# Patient Record
Sex: Female | Born: 1990
Health system: Southern US, Community
[De-identification: ages and names within clinical notes are randomized; demographics above are authoritative.]

## PROBLEM LIST (undated history)

## (undated) ENCOUNTER — Inpatient Hospital Stay (HOSPITAL_COMMUNITY): Payer: Self-pay

## (undated) DIAGNOSIS — Z3400 Encounter for supervision of normal first pregnancy, unspecified trimester: Secondary | ICD-10-CM

## (undated) DIAGNOSIS — F411 Generalized anxiety disorder: Secondary | ICD-10-CM

## (undated) DIAGNOSIS — R51 Headache: Secondary | ICD-10-CM

## (undated) DIAGNOSIS — R519 Headache, unspecified: Secondary | ICD-10-CM

## (undated) DIAGNOSIS — F53 Postpartum depression: Principal | ICD-10-CM

## (undated) DIAGNOSIS — O99345 Other mental disorders complicating the puerperium: Principal | ICD-10-CM

## (undated) DIAGNOSIS — R87629 Unspecified abnormal cytological findings in specimens from vagina: Secondary | ICD-10-CM

## (undated) HISTORY — DX: Generalized anxiety disorder: F41.1

## (undated) HISTORY — DX: Encounter for supervision of normal first pregnancy, unspecified trimester: Z34.00

## (undated) HISTORY — PX: NO PAST SURGERIES: SHX2092

## (undated) HISTORY — DX: Other mental disorders complicating the puerperium: O99.345

## (undated) HISTORY — DX: Postpartum depression: F53.0

## (undated) HISTORY — DX: Headache, unspecified: R51.9

## (undated) HISTORY — DX: Unspecified abnormal cytological findings in specimens from vagina: R87.629

## (undated) HISTORY — DX: Headache: R51

---

## 2014-04-20 ENCOUNTER — Encounter (HOSPITAL_COMMUNITY): Payer: Self-pay | Admitting: Emergency Medicine

## 2014-04-20 ENCOUNTER — Emergency Department (HOSPITAL_COMMUNITY)
Admission: EM | Admit: 2014-04-20 | Discharge: 2014-04-21 | Disposition: A | Discharge status: Home / Self Care | Payer: Self-pay | Attending: Emergency Medicine | Admitting: Emergency Medicine

## 2014-04-20 DIAGNOSIS — Z720 Tobacco use: Secondary | ICD-10-CM | POA: Insufficient documentation

## 2014-04-20 DIAGNOSIS — Z3202 Encounter for pregnancy test, result negative: Secondary | ICD-10-CM | POA: Insufficient documentation

## 2014-04-20 DIAGNOSIS — K5900 Constipation, unspecified: Secondary | ICD-10-CM | POA: Insufficient documentation

## 2014-04-20 NOTE — ED Notes (Signed)
Pt arrived to the ED with a complaint of lower abdominal pain.  Pt states that she has been having intermittent sharp pain below her umbilicus.  Pt states she had hernia problems early in her life but it resolved itself many years ago.  Pt states pain has been present for two weeks.  Pt describes pain as sharp and intermittent.

## 2014-04-21 ENCOUNTER — Emergency Department (HOSPITAL_COMMUNITY): Payer: Self-pay

## 2014-04-21 LAB — URINALYSIS, ROUTINE W REFLEX MICROSCOPIC
GLUCOSE, UA: NEGATIVE mg/dL
Hgb urine dipstick: NEGATIVE
KETONES UR: NEGATIVE mg/dL
NITRITE: NEGATIVE
PH: 6 (ref 5.0–8.0)
Protein, ur: NEGATIVE mg/dL
Specific Gravity, Urine: 1.028 (ref 1.005–1.030)
Urobilinogen, UA: 1 mg/dL (ref 0.0–1.0)

## 2014-04-21 LAB — URINE MICROSCOPIC-ADD ON

## 2014-04-21 LAB — PREGNANCY, URINE: Preg Test, Ur: NEGATIVE

## 2014-04-21 MED ORDER — POLYETHYLENE GLYCOL 3350 17 GM/SCOOP PO POWD: 17.0000 g | Freq: Every day | ORAL | Status: DC

## 2014-04-21 NOTE — Discharge Instructions (Signed)
Constipation Constipation is when a person:  Poops (has a bowel movement) less than 3 times a week.  Has a hard time pooping.  Has poop that is dry, hard, or bigger than normal. HOME CARE   Eat foods with a lot of fiber in them. This includes fruits, vegetables, beans, and whole grains such as brown rice.  Avoid fatty foods and foods with a lot of sugar. This includes french fries, hamburgers, cookies, candy, and soda.  If you are not getting enough fiber from food, take products with added fiber in them (supplements).  Drink enough fluid to keep your pee (urine) clear or pale yellow.  Exercise on a regular basis, or as told by your doctor.  Go to the restroom when you feel like you need to poop. Do not hold it.  Only take medicine as told by your doctor. Do not take medicines that help you poop (laxatives) without talking to your doctor first. GET HELP RIGHT AWAY IF:   You have bright red blood in your poop (stool).  Your constipation lasts more than 4 days or gets worse.  You have belly (abdominal) or butt (rectal) pain.  You have thin poop (as thin as a pencil).  You lose weight, and it cannot be explained. MAKE SURE YOU:   Understand these instructions.  Will watch your condition.  Will get help right away if you are not doing well or get worse. Document Released: 08/08/2007 Document Revised: 02/24/2013 Document Reviewed: 12/01/2012 [facility] Patient Information 2015 [place], [place]. This information is not intended to replace advice given to you by your health care provider. Make sure you discuss any questions you have with your health care provider. No sign of hernia on your x-ray.  You do have quite a bit of stool in your colon.  This can cause some crampy intermittent abdominal pain.  Please take the [place] lax as directed to help you have regular bowel movements Constipation Constipation is when a person:  Poops (has a bowel movement) less than 3 times a  week.  Has a hard time pooping.  Has poop that is dry, hard, or bigger than normal. HOME CARE   Eat foods with a lot of fiber in them. This includes fruits, vegetables, beans, and whole grains such as brown rice.  Avoid fatty foods and foods with a lot of sugar. This includes french fries, hamburgers, cookies, candy, and soda.  If you are not getting enough fiber from food, take products with added fiber in them (supplements).  Drink enough fluid to keep your pee (urine) clear or pale yellow.  Exercise on a regular basis, or as told by your doctor.  Go to the restroom when you feel like you need to poop. Do not hold it.  Only take medicine as told by your doctor. Do not take medicines that help you poop (laxatives) without talking to your doctor first. GET HELP RIGHT AWAY IF:   You have bright red blood in your poop (stool).  Your constipation lasts more than 4 days or gets worse.  You have belly (abdominal) or butt (rectal) pain.  You have thin poop (as thin as a pencil).  You lose weight, and it cannot be explained. MAKE SURE YOU:   Understand these instructions.  Will watch your condition.  Will get help right away if you are not doing well or get worse. Document Released: 08/08/2007 Document Revised: 02/24/2013 Document Reviewed: 12/01/2012 [facility] Patient Information 2015 [place], [place]. This information is  not intended to replace advice given to you by your health care provider. Make sure you discuss any questions you have with your health care provider. ° °

## 2014-04-21 NOTE — ED Provider Notes (Signed)
CSN: [id number]     Arrival date & time 04/20/14  2130 History   First MD Initiated Contact with Patient 04/21/14 0112     Chief Complaint  Patient presents with  . Abdominal Pain     (Consider location/radiation/quality/duration/timing/severity/associated sxs/prior Treatment) HPI Comments: Patient states that since she was a child.  She's had periumbilical pain.  She was told at one point that she had an umbilical hernia that did not need surgery.  She presents now with intermittent.  Umbilical pain on and off for years, worse over the last 24 hours.  She states the pain comes it lasts approximate 10 minutes and resolves on its own.  She does not notice any bulging or protrusion of the umbilicus.  Denies any dysuria, nausea, vomiting, diarrhea, constipation, abdominal trauma, vaginal discharge.  States she's been on the Depo  shot and has not had a menstrual cycle in over a year  Patient is a 24 y.o. female presenting with abdominal pain. The history is provided by the patient.  Abdominal Pain Pain location:  Periumbilical Pain quality: aching   Pain radiates to:  Does not radiate Pain severity:  No pain Onset quality:  Unable to specify Timing:  Intermittent Progression:  Worsening Chronicity:  Chronic Relieved by:  None tried Worsened by:  Nothing tried Ineffective treatments:  None tried Associated symptoms: no constipation, no cough, no diarrhea, no dysuria, no fever, no nausea, no shortness of breath and no vaginal discharge     History reviewed. No pertinent past medical history. History reviewed. No pertinent past surgical history. History reviewed. No pertinent family history. History  Substance Use Topics  . Smoking status: Current Some Day Smoker -- 0.50 packs/day    Types: Cigarettes  . Smokeless tobacco: Never Used  . Alcohol Use: Yes   OB History    No data available     Review of Systems  Constitutional: Negative for fever.  Respiratory: Negative for cough  and shortness of breath.   Gastrointestinal: Positive for abdominal pain. Negative for nausea, diarrhea, constipation and abdominal distention.  Genitourinary: Negative for dysuria, flank pain and vaginal discharge.  Skin: Negative for rash.  All other systems reviewed and are negative.     Allergies  Tomato  Home Medications   Prior to Admission medications   Medication Sig Start Date End Date Taking? Authorizing Provider  ibuprofen (ADVIL,MOTRIN) 200 MG tablet Take 400 mg by mouth every 6 (six) hours as needed for moderate pain.   Yes Historical Provider, MD  medroxyPROGESTERone (DEPO-PROVERA) 150 MG/ML injection Inject 150 mg into the muscle every 3 (three) months.   Yes Historical Provider, MD  polyethylene glycol powder (GLYCOLAX/MIRALAX) powder Take 17 g by mouth daily. 04/21/14   Arman Filter, NP   BP 115/59 mmHg  Pulse 66  Temp(Src) 98.3 F (36.8 C) (Oral)  Resp 16  SpO2 98%  LMP  Physical Exam  Constitutional: She is oriented to person, place, and time. She appears well-developed and well-nourished.  HENT:  Head: Normocephalic and atraumatic.  Eyes: Pupils are equal, round, and reactive to light.  Cardiovascular: Normal rate.   Pulmonary/Chest: Effort normal and breath sounds normal.  Abdominal: Soft. Bowel sounds are normal. She exhibits no distension. There is no tenderness.  Musculoskeletal: Normal range of motion.  Neurological: She is alert and oriented to person, place, and time.  Skin: Skin is warm and dry.  Nursing note and vitals reviewed.   ED Course  Procedures (including critical care time)  Labs Review Labs Reviewed  URINALYSIS, ROUTINE W REFLEX MICROSCOPIC - Abnormal; Notable for the following:    Color, Urine AMBER (*)    APPearance HAZY (*)    Bilirubin Urine SMALL (*)    Leukocytes, UA SMALL (*)    All other components within normal limits  URINE MICROSCOPIC-ADD ON - Abnormal; Notable for the following:    Squamous Epithelial / LPF FEW (*)     Bacteria, UA FEW (*)    All other components within normal limits  PREGNANCY, URINE    Imaging Review Dg Abd Acute W/chest  04/21/2014   CLINICAL DATA:  Initial evaluation for umbilical pain for 2 weeks.  EXAM: ACUTE ABDOMEN SERIES (ABDOMEN 2 VIEW & CHEST 1 VIEW)  COMPARISON:  None.  FINDINGS: Cardiac and mediastinal silhouettes are within normal limits.  Lungs are normally inflated. No focal infiltrate, pulmonary edema, or pleural effusion. There is no pneumothorax.  Bowel gas pattern is nonobstructive. Paucity of gas limits evaluation of the small bowel. Moderate amount of retained stool present throughout the colon. No abnormal bowel wall thickening. No free air. No soft tissue mass or abnormal calcification.  No acute osseous abnormality.  IMPRESSION: 1. Nonobstructive bowel gas pattern with no radiographic evidence for acute intra-abdominal process. 2. Moderate amount of retained stool within the colon, which may reflect constipation. 3. No active cardiopulmonary disease.   Electronically Signed   By: Rise MuBenjamin  McClintock M.D.   On: 04/21/2014 02:22     EKG Interpretation None      MDM   Final diagnoses:  Constipation, unspecified constipation type         Arman FilterGail K Yazeed Pryer, NP 04/21/14 [id number]  Olivia Mackielga M Otter, MD 04/21/14 [phone]

## 2016-12-27 ENCOUNTER — Ambulatory Visit (INDEPENDENT_AMBULATORY_CARE_PROVIDER_SITE_OTHER): Payer: Federal, State, Local not specified - PPO

## 2016-12-27 VITALS — BP 127/77 | HR 68 | Ht 61.0 in | Wt 146.0 lb

## 2016-12-27 DIAGNOSIS — Z3400 Encounter for supervision of normal first pregnancy, unspecified trimester: Secondary | ICD-10-CM

## 2016-12-27 DIAGNOSIS — Z3201 Encounter for pregnancy test, result positive: Secondary | ICD-10-CM | POA: Diagnosis not present

## 2016-12-27 DIAGNOSIS — Z3200 Encounter for pregnancy test, result unknown: Secondary | ICD-10-CM

## 2016-12-27 HISTORY — DX: Encounter for supervision of normal first pregnancy, unspecified trimester: Z34.00

## 2016-12-27 LAB — POCT URINE PREGNANCY: Preg Test, Ur: POSITIVE — AB

## 2016-12-27 NOTE — Progress Notes (Signed)
Presents for UPT-POSITIVE. LMP 11/03/16; EDD 08/10/17. Patient advised to make appt for NOB care.  . [name] presents today for UPT. She has no unusual complaints. LMP: 11/03/16    OBJECTIVE: Appears well, in no apparent distress.  OB History    Gravida Para Term Preterm AB Living   1             SAB TAB Ectopic Multiple Live Births                 Home UPT Result:Positive In-Office UPT result: POSITIVE  I have reviewed the patient's medical, obstetrical, social, and family histories, and medications.   ASSESSMENT: Positive pregnancy test  PLAN Prenatal care to be completed at: [facility]

## 2016-12-31 NOTE — Progress Notes (Signed)
Agree with nursing staff's documentation of this patient's clinic encounter.  Jozette Castrellon A Annalei Friesz, CNM    

## 2017-01-21 ENCOUNTER — Encounter: Payer: Self-pay | Admitting: Obstetrics and Gynecology

## 2017-01-21 ENCOUNTER — Ambulatory Visit (INDEPENDENT_AMBULATORY_CARE_PROVIDER_SITE_OTHER): Payer: Federal, State, Local not specified - PPO | Admitting: Obstetrics and Gynecology

## 2017-01-21 DIAGNOSIS — Z23 Encounter for immunization: Secondary | ICD-10-CM

## 2017-01-21 DIAGNOSIS — Z113 Encounter for screening for infections with a predominantly sexual mode of transmission: Secondary | ICD-10-CM | POA: Diagnosis not present

## 2017-01-21 DIAGNOSIS — Z3401 Encounter for supervision of normal first pregnancy, first trimester: Secondary | ICD-10-CM | POA: Diagnosis not present

## 2017-01-21 DIAGNOSIS — Z3400 Encounter for supervision of normal first pregnancy, unspecified trimester: Secondary | ICD-10-CM | POA: Diagnosis not present

## 2017-01-21 DIAGNOSIS — Z124 Encounter for screening for malignant neoplasm of cervix: Secondary | ICD-10-CM | POA: Diagnosis not present

## 2017-01-21 MED ORDER — VITAFOL ULTRA 29-0.6-0.4-200 MG PO CAPS: 1.0000 | ORAL_CAPSULE | Freq: Every day | ORAL | 12 refills | Status: DC

## 2017-01-21 NOTE — Patient Instructions (Signed)
 First Trimester of Pregnancy The first trimester of pregnancy is from week 1 until the end of week 13 (months 1 through 3). A week after a sperm fertilizes an egg, the egg will implant on the wall of the uterus. This embryo will begin to develop into a baby. Genes from you and your partner will form the baby. The female genes will determine whether the baby will be a boy or a girl. At 6-8 weeks, the eyes and face will be formed, and the heartbeat can be seen on ultrasound. At the end of 12 weeks, all the baby's organs will be formed. Now that you are pregnant, you will want to do everything you can to have a healthy baby. Two of the most important things are to get good prenatal care and to follow your health care provider's instructions. Prenatal care is all the medical care you receive before the baby's birth. This care will help prevent, find, and treat any problems during the pregnancy and childbirth. Body changes during your first trimester Your body goes through many changes during pregnancy. The changes vary from woman to woman.  You may gain or lose a couple of pounds at first.  You may feel sick to your stomach (nauseous) and you may throw up (vomit). If the vomiting is uncontrollable, call your health care provider.  You may tire easily.  You may develop headaches that can be relieved by medicines. All medicines should be approved by your health care provider.  You may urinate more often. Painful urination may mean you have a bladder infection.  You may develop heartburn as a result of your pregnancy.  You may develop constipation because certain hormones are causing the muscles that push stool through your intestines to slow down.  You may develop hemorrhoids or swollen veins (varicose veins).  Your breasts may begin to grow larger and become tender. Your nipples may stick out more, and the tissue that surrounds them (areola) may become darker.  Your gums may bleed and may be  sensitive to brushing and flossing.  Dark spots or blotches (chloasma, mask of pregnancy) may develop on your face. This will likely fade after the baby is born.  Your menstrual periods will stop.  You may have a loss of appetite.  You may develop cravings for certain kinds of food.  You may have changes in your emotions from day to day, such as being excited to be pregnant or being concerned that something may go wrong with the pregnancy and baby.  You may have more vivid and strange dreams.  You may have changes in your hair. These can include thickening of your hair, rapid growth, and changes in texture. Some women also have hair loss during or after pregnancy, or hair that feels dry or thin. Your hair will most likely return to normal after your baby is born.  What to expect at prenatal visits During a routine prenatal visit:  You will be weighed to make sure you and the baby are growing normally.  Your blood pressure will be taken.  Your abdomen will be measured to track your baby's growth.  The fetal heartbeat will be listened to between weeks 10 and 14 of your pregnancy.  Test results from any previous visits will be discussed.  Your health care provider may ask you:  How you are feeling.  If you are feeling the baby move.  If you have had any abnormal symptoms, such as leaking fluid, bleeding, severe   headaches, or abdominal cramping.  If you are using any tobacco products, including cigarettes, chewing tobacco, and electronic cigarettes.  If you have any questions.  Other tests that may be performed during your first trimester include:  Blood tests to find your blood type and to check for the presence of any previous infections. The tests will also be used to check for low iron levels (anemia) and protein on red blood cells (Rh antibodies). Depending on your risk factors, or if you previously had diabetes during pregnancy, you may have tests to check for high blood  sugar that affects pregnant women (gestational diabetes).  Urine tests to check for infections, diabetes, or protein in the urine.  An ultrasound to confirm the proper growth and development of the baby.  Fetal screens for spinal cord problems (spina bifida) and Down syndrome.  HIV (human immunodeficiency virus) testing. Routine prenatal testing includes screening for HIV, unless you choose not to have this test.  You may need other tests to make sure you and the baby are doing well.  Follow these instructions at home: Medicines  Follow your health care provider's instructions regarding medicine use. Specific medicines may be either safe or unsafe to take during pregnancy.  Take a prenatal vitamin that contains at least 600 micrograms (mcg) of folic acid.  If you develop constipation, try taking a stool softener if your health care provider approves. Eating and drinking  Eat a balanced diet that includes fresh fruits and vegetables, whole grains, good sources of protein such as meat, eggs, or tofu, and low-fat dairy. Your health care provider will help you determine the amount of weight gain that is right for you.  Avoid raw meat and uncooked cheese. These carry germs that can cause birth defects in the baby.  Eating four or five small meals rather than three large meals a day may help relieve nausea and vomiting. If you start to feel nauseous, eating a few soda crackers can be helpful. Drinking liquids between meals, instead of during meals, also seems to help ease nausea and vomiting.  Limit foods that are high in fat and processed sugars, such as fried and sweet foods.  To prevent constipation: ? Eat foods that are high in fiber, such as fresh fruits and vegetables, whole grains, and beans. ? Drink enough fluid to keep your urine clear or pale yellow. Activity  Exercise only as directed by your health care provider. Most women can continue their usual exercise routine during  pregnancy. Try to exercise for 30 minutes at least 5 days a week. Exercising will help you: ? Control your weight. ? Stay in shape. ? Be prepared for labor and delivery.  Experiencing pain or cramping in the lower abdomen or lower back is a good sign that you should stop exercising. Check with your health care provider before continuing with normal exercises.  Try to avoid standing for long periods of time. Move your legs often if you must stand in one place for a long time.  Avoid heavy lifting.  Wear low-heeled shoes and practice good posture.  You may continue to have sex unless your health care provider tells you not to. Relieving pain and discomfort  Wear a good support bra to relieve breast tenderness.  Take warm sitz baths to soothe any pain or discomfort caused by hemorrhoids. Use hemorrhoid cream if your health care provider approves.  Rest with your legs elevated if you have leg cramps or low back pain.  If you   develop varicose veins in your legs, wear support hose. Elevate your feet for 15 minutes, 3-4 times a day. Limit salt in your diet. Prenatal care  Schedule your prenatal visits by the twelfth week of pregnancy. They are usually scheduled monthly at first, then more often in the last 2 months before delivery.  Write down your questions. Take them to your prenatal visits.  Keep all your prenatal visits as told by your health care provider. This is important. Safety  Wear your seat belt at all times when driving.  Make a list of emergency phone numbers, including numbers for family, friends, the hospital, and police and fire departments. General instructions  Ask your health care provider for a referral to a local prenatal education class. Begin classes no later than the beginning of month 6 of your pregnancy.  Ask for help if you have counseling or nutritional needs during pregnancy. Your health care provider can offer advice or refer you to specialists for help  with various needs.  Do not use hot tubs, steam rooms, or saunas.  Do not douche or use tampons or scented sanitary pads.  Do not cross your legs for long periods of time.  Avoid cat litter boxes and soil used by cats. These carry germs that can cause birth defects in the baby and possibly loss of the fetus by miscarriage or stillbirth.  Avoid all smoking, herbs, alcohol, and medicines not prescribed by your health care provider. Chemicals in these products affect the formation and growth of the baby.  Do not use any products that contain nicotine or tobacco, such as cigarettes and e-cigarettes. If you need help quitting, ask your health care provider. You may receive counseling support and other resources to help you quit.  Schedule a dentist appointment. At home, brush your teeth with a soft toothbrush and be gentle when you floss. Contact a health care provider if:  You have dizziness.  You have mild pelvic cramps, pelvic pressure, or nagging pain in the abdominal area.  You have persistent nausea, vomiting, or diarrhea.  You have a bad smelling vaginal discharge.  You have pain when you urinate.  You notice increased swelling in your face, hands, legs, or ankles.  You are exposed to fifth disease or chickenpox.  You are exposed to German measles (rubella) and have never had it. Get help right away if:  You have a fever.  You are leaking fluid from your vagina.  You have spotting or bleeding from your vagina.  You have severe abdominal cramping or pain.  You have rapid weight gain or loss.  You vomit blood or material that looks like coffee grounds.  You develop a severe headache.  You have shortness of breath.  You have any kind of trauma, such as from a fall or a car accident. Summary  The first trimester of pregnancy is from week 1 until the end of week 13 (months 1 through 3).  Your body goes through many changes during pregnancy. The changes vary from  woman to woman.  You will have routine prenatal visits. During those visits, your health care provider will examine you, discuss any test results you may have, and talk with you about how you are feeling. This information is not intended to replace advice given to you by your health care provider. Make sure you discuss any questions you have with your health care provider. Document Released: 02/13/2001 Document Revised: 02/01/2016 Document Reviewed: 02/01/2016 Elsevier Interactive Patient Education  2017   [employer]   Second Trimester of Pregnancy The second trimester is from week 14 through week 27 (months 4 through 6). The second trimester is often a time when you feel your best. Your body has adjusted to being pregnant, and you begin to feel better physically. Usually, morning sickness has lessened or quit completely, you may have more energy, and you may have an increase in appetite. The second trimester is also a time when the fetus is growing rapidly. At the end of the sixth month, the fetus is about 9 inches long and weighs about 1 pounds. You will likely begin to feel the baby move (quickening) between 16 and 20 weeks of pregnancy. Body changes during your second trimester Your body continues to go through many changes during your second trimester. The changes vary from woman to woman.  Your weight will continue to increase. You will notice your lower abdomen bulging out.  You may begin to get stretch marks on your hips, abdomen, and breasts.  You may develop headaches that can be relieved by medicines. The medicines should be approved by your health care provider.  You may urinate more often because the fetus is pressing on your bladder.  You may develop or continue to have heartburn as a result of your pregnancy.  You may develop constipation because certain hormones are causing the muscles that push waste through your intestines to slow down.  You may develop hemorrhoids or  swollen, bulging veins (varicose veins).  You may have back pain. This is caused by: ? Weight gain. ? Pregnancy hormones that are relaxing the joints in your pelvis. ? A shift in weight and the muscles that support your balance.  Your breasts will continue to grow and they will continue to become tender.  Your gums may bleed and may be sensitive to brushing and flossing.  Dark spots or blotches (chloasma, mask of pregnancy) may develop on your face. This will likely fade after the baby is born.  A dark line from your belly button to the pubic area (linea nigra) may appear. This will likely fade after the baby is born.  You may have changes in your hair. These can include thickening of your hair, rapid growth, and changes in texture. Some women also have hair loss during or after pregnancy, or hair that feels dry or thin. Your hair will most likely return to normal after your baby is born.  What to expect at prenatal visits During a routine prenatal visit:  You will be weighed to make sure you and the fetus are growing normally.  Your blood pressure will be taken.  Your abdomen will be measured to track your baby's growth.  The fetal heartbeat will be listened to.  Any test results from the previous visit will be discussed.  Your health care provider may ask you:  How you are feeling.  If you are feeling the baby move.  If you have had any abnormal symptoms, such as leaking fluid, bleeding, severe headaches, or abdominal cramping.  If you are using any tobacco products, including cigarettes, chewing tobacco, and electronic cigarettes.  If you have any questions.  Other tests that may be performed during your second trimester include:  Blood tests that check for: ? Low iron levels (anemia). ? High blood sugar that affects pregnant women (gestational diabetes) between 24 and 28 weeks. ? Rh antibodies. This is to check for a protein on red blood cells (Rh factor).  Urine  tests to   check for infections, diabetes, or protein in the urine.  An ultrasound to confirm the proper growth and development of the baby.  An amniocentesis to check for possible genetic problems.  Fetal screens for spina bifida and Down syndrome.  HIV (human immunodeficiency virus) testing. Routine prenatal testing includes screening for HIV, unless you choose not to have this test.  Follow these instructions at home: Medicines  Follow your health care provider's instructions regarding medicine use. Specific medicines may be either safe or unsafe to take during pregnancy.  Take a prenatal vitamin that contains at least 600 micrograms (mcg) of folic acid.  If you develop constipation, try taking a stool softener if your health care provider approves. Eating and drinking  Eat a balanced diet that includes fresh fruits and vegetables, whole grains, good sources of protein such as meat, eggs, or tofu, and low-fat dairy. Your health care provider will help you determine the amount of weight gain that is right for you.  Avoid raw meat and uncooked cheese. These carry germs that can cause birth defects in the baby.  If you have low calcium intake from food, talk to your health care provider about whether you should take a daily calcium supplement.  Limit foods that are high in fat and processed sugars, such as fried and sweet foods.  To prevent constipation: ? Drink enough fluid to keep your urine clear or pale yellow. ? Eat foods that are high in fiber, such as fresh fruits and vegetables, whole grains, and beans. Activity  Exercise only as directed by your health care provider. Most women can continue their usual exercise routine during pregnancy. Try to exercise for 30 minutes at least 5 days a week. Stop exercising if you experience uterine contractions.  Avoid heavy lifting, wear low heel shoes, and practice good posture.  A sexual relationship may be continued unless your health  care provider directs you otherwise. Relieving pain and discomfort  Wear a good support bra to prevent discomfort from breast tenderness.  Take warm sitz baths to soothe any pain or discomfort caused by hemorrhoids. Use hemorrhoid cream if your health care provider approves.  Rest with your legs elevated if you have leg cramps or low back pain.  If you develop varicose veins, wear support hose. Elevate your feet for 15 minutes, 3-4 times a day. Limit salt in your diet. Prenatal Care  Write down your questions. Take them to your prenatal visits.  Keep all your prenatal visits as told by your health care provider. This is important. Safety  Wear your seat belt at all times when driving.  Make a list of emergency phone numbers, including numbers for family, friends, the hospital, and police and fire departments. General instructions  Ask your health care provider for a referral to a local prenatal education class. Begin classes no later than the beginning of month 6 of your pregnancy.  Ask for help if you have counseling or nutritional needs during pregnancy. Your health care provider can offer advice or refer you to specialists for help with various needs.  Do not use hot tubs, steam rooms, or saunas.  Do not douche or use tampons or scented sanitary pads.  Do not cross your legs for long periods of time.  Avoid cat litter boxes and soil used by cats. These carry germs that can cause birth defects in the baby and possibly loss of the fetus by miscarriage or stillbirth.  Avoid all smoking, herbs, alcohol, and unprescribed drugs. Chemicals   in these products can affect the formation and growth of the baby.  Do not use any products that contain nicotine or tobacco, such as cigarettes and e-cigarettes. If you need help quitting, ask your health care provider.  Visit your dentist if you have not gone yet during your pregnancy. Use a soft toothbrush to brush your teeth and be gentle when  you floss. Contact a health care provider if:  You have dizziness.  You have mild pelvic cramps, pelvic pressure, or nagging pain in the abdominal area.  You have persistent nausea, vomiting, or diarrhea.  You have a bad smelling vaginal discharge.  You have pain when you urinate. Get help right away if:  You have a fever.  You are leaking fluid from your vagina.  You have spotting or bleeding from your vagina.  You have severe abdominal cramping or pain.  You have rapid weight gain or weight loss.  You have shortness of breath with chest pain.  You notice sudden or extreme swelling of your face, hands, ankles, feet, or legs.  You have not felt your baby move in over an hour.  You have severe headaches that do not go away when you take medicine.  You have vision changes. Summary  The second trimester is from week 14 through week 27 (months 4 through 6). It is also a time when the fetus is growing rapidly.  Your body goes through many changes during pregnancy. The changes vary from woman to woman.  Avoid all smoking, herbs, alcohol, and unprescribed drugs. These chemicals affect the formation and growth your baby.  Do not use any tobacco products, such as cigarettes, chewing tobacco, and e-cigarettes. If you need help quitting, ask your health care provider.  Contact your health care provider if you have any questions. Keep all prenatal visits as told by your health care provider. This is important. This information is not intended to replace advice given to you by your health care provider. Make sure you discuss any questions you have with your health care provider. Document Released: 02/13/2001 Document Revised: 07/28/2015 Document Reviewed: 04/22/2012 Elsevier Interactive Patient Education  2017 [employer].  Contraception Choices Contraception (birth control) is the use of any methods or devices to prevent pregnancy. Below are some methods to help avoid  pregnancy. Hormonal methods  Contraceptive implant. This is a thin, plastic tube containing progesterone hormone. It does not contain estrogen hormone. Your health care provider inserts the tube in the inner part of the upper arm. The tube can remain in place for up to 3 years. After 3 years, the implant must be removed. The implant prevents the ovaries from releasing an egg (ovulation), thickens the cervical mucus to prevent sperm from entering the uterus, and thins the lining of the inside of the uterus.  Progesterone-only injections. These injections are given every 3 months by your health care provider to prevent pregnancy. This synthetic progesterone hormone stops the ovaries from releasing eggs. It also thickens cervical mucus and changes the uterine lining. This makes it harder for sperm to survive in the uterus.  Birth control pills. These pills contain estrogen and progesterone hormone. They work by preventing the ovaries from releasing eggs (ovulation). They also cause the cervical mucus to thicken, preventing the sperm from entering the uterus. Birth control pills are prescribed by a health care provider.Birth control pills can also be used to treat heavy periods.  Minipill. This type of birth control pill contains only the progesterone hormone. They are  taken every day of each month and must be prescribed by your health care provider.  Birth control patch. The patch contains hormones similar to those in birth control pills. It must be changed once a week and is prescribed by a health care provider.  Vaginal ring. The ring contains hormones similar to those in birth control pills. It is left in the vagina for 3 weeks, removed for 1 week, and then a new one is put back in place. The patient must be comfortable inserting and removing the ring from the vagina.A health care provider's prescription is necessary.  Emergency contraception. Emergency contraceptives prevent pregnancy after  unprotected sexual intercourse. This pill can be taken right after sex or up to 5 days after unprotected sex. It is most effective the sooner you take the pills after having sexual intercourse. Most emergency contraceptive pills are available without a prescription. Check with your pharmacist. Do not use emergency contraception as your only form of birth control. Barrier methods  Female condom. This is a thin sheath (latex or rubber) that is worn over the penis during sexual intercourse. It can be used with spermicide to increase effectiveness.  Female condom. This is a soft, loose-fitting sheath that is put into the vagina before sexual intercourse.  Diaphragm. This is a soft, latex, dome-shaped barrier that must be fitted by a health care provider. It is inserted into the vagina, along with a spermicidal jelly. It is inserted before intercourse. The diaphragm should be left in the vagina for 6 to 8 hours after intercourse.  Cervical cap. This is a round, soft, latex or plastic cup that fits over the cervix and must be fitted by a health care provider. The cap can be left in place for up to 48 hours after intercourse.  Sponge. This is a soft, circular piece of polyurethane foam. The sponge has spermicide in it. It is inserted into the vagina after wetting it and before sexual intercourse.  Spermicides. These are chemicals that kill or block sperm from entering the cervix and uterus. They come in the form of creams, jellies, suppositories, foam, or tablets. They do not require a prescription. They are inserted into the vagina with an applicator before having sexual intercourse. The process must be repeated every time you have sexual intercourse. Intrauterine contraception  Intrauterine device (IUD). This is a T-shaped device that is put in a woman's uterus during a menstrual period to prevent pregnancy. There are 2 types: ? Copper IUD. This type of IUD is wrapped in copper wire and is placed inside  the uterus. Copper makes the uterus and fallopian tubes produce a fluid that kills sperm. It can stay in place for 10 years. ? Hormone IUD. This type of IUD contains the hormone progestin (synthetic progesterone). The hormone thickens the cervical mucus and prevents sperm from entering the uterus, and it also thins the uterine lining to prevent implantation of a fertilized egg. The hormone can weaken or kill the sperm that get into the uterus. It can stay in place for 3-5 years, depending on which type of IUD is used. Permanent methods of contraception  Female tubal ligation. This is when the woman's fallopian tubes are surgically sealed, tied, or blocked to prevent the egg from traveling to the uterus.  Hysteroscopic sterilization. This involves placing a small coil or insert into each fallopian tube. Your doctor uses a technique called hysteroscopy to do the procedure. The device causes scar tissue to form. This results in permanent blockage  of the fallopian tubes, so the sperm cannot fertilize the egg. It takes about 3 months after the procedure for the tubes to become blocked. You must use another form of birth control for these 3 months.  Female sterilization. This is when the female has the tubes that carry sperm tied off (vasectomy).This blocks sperm from entering the vagina during sexual intercourse. After the procedure, the man can still ejaculate fluid (semen). Natural planning methods  Natural family planning. This is not having sexual intercourse or using a barrier method (condom, diaphragm, cervical cap) on days the woman could become pregnant.  Calendar method. This is keeping track of the length of each menstrual cycle and identifying when you are fertile.  Ovulation method. This is avoiding sexual intercourse during ovulation.  Symptothermal method. This is avoiding sexual intercourse during ovulation, using a thermometer and ovulation symptoms.  Post-ovulation method. This is timing  sexual intercourse after you have ovulated. Regardless of which type or method of contraception you choose, it is important that you use condoms to protect against the transmission of sexually transmitted infections (STIs). Talk with your health care provider about which form of contraception is most appropriate for you. This information is not intended to replace advice given to you by your health care provider. Make sure you discuss any questions you have with your health care provider. Document Released: 02/19/2005 Document Revised: 07/28/2015 Document Reviewed: 08/14/2012 Elsevier Interactive Patient Education  2017 [employer]   Breastfeeding Deciding to breastfeed is one of the best choices you can make for you and your baby. A change in hormones during pregnancy causes your breast tissue to grow and increases the number and size of your milk ducts. These hormones also allow proteins, sugars, and fats from your blood supply to make breast milk in your milk-producing glands. Hormones prevent breast milk from being released before your baby is born as well as prompt milk flow after birth. Once breastfeeding has begun, thoughts of your baby, as well as his or her sucking or crying, can stimulate the release of milk from your milk-producing glands. Benefits of breastfeeding For Your Baby  Your first milk (colostrum) helps your baby's digestive system function better.  There are antibodies in your milk that help your baby fight off infections.  Your baby has a lower incidence of asthma, allergies, and sudden infant death syndrome.  The nutrients in breast milk are better for your baby than infant formulas and are designed uniquely for your baby's needs.  Breast milk improves your baby's brain development.  Your baby is less likely to develop other conditions, such as childhood obesity, asthma, or type 2 diabetes mellitus.  For You  Breastfeeding helps to create a very special bond  between you and your baby.  Breastfeeding is convenient. Breast milk is always available at the correct temperature and costs nothing.  Breastfeeding helps to burn calories and helps you lose the weight gained during pregnancy.  Breastfeeding makes your uterus contract to its prepregnancy size faster and slows bleeding (lochia) after you give birth.  Breastfeeding helps to lower your risk of developing type 2 diabetes mellitus, osteoporosis, and breast or ovarian cancer later in life.  Signs that your baby is hungry Early Signs of Hunger  Increased alertness or activity.  Stretching.  Movement of the head from side to side.  Movement of the head and opening of the mouth when the corner of the mouth or cheek is stroked (rooting).  Increased sucking sounds, smacking lips,  cooing, sighing, or squeaking.  Hand-to-mouth movements.  Increased sucking of fingers or hands.  Late Signs of Hunger  Fussing.  Intermittent crying.  Extreme Signs of Hunger Signs of extreme hunger will require calming and consoling before your baby will be able to breastfeed successfully. Do not wait for the following signs of extreme hunger to occur before you initiate breastfeeding:  Restlessness.  A loud, strong cry.  Screaming.  Breastfeeding basics Breastfeeding Initiation  Find a comfortable place to sit or lie down, with your neck and back well supported.  Place a pillow or rolled up blanket under your baby to bring him or her to the level of your breast (if you are seated). Nursing pillows are specially designed to help support your arms and your baby while you breastfeed.  Make sure that your baby's abdomen is facing your abdomen.  Gently massage your breast. With your fingertips, massage from your chest wall toward your nipple in a circular motion. This encourages milk flow. You may need to continue this action during the feeding if your milk flows slowly.  Support your breast with 4  fingers underneath and your thumb above your nipple. Make sure your fingers are well away from your nipple and your baby's mouth.  Stroke your baby's lips gently with your finger or nipple.  When your baby's mouth is open wide enough, quickly bring your baby to your breast, placing your entire nipple and as much of the colored area around your nipple (areola) as possible into your baby's mouth. ? More areola should be visible above your baby's upper lip than below the lower lip. ? Your baby's tongue should be between his or her lower gum and your breast.  Ensure that your baby's mouth is correctly positioned around your nipple (latched). Your baby's lips should create a seal on your breast and be turned out (everted).  It is common for your baby to suck about 2-3 minutes in order to start the flow of breast milk.  Latching Teaching your baby how to latch on to your breast properly is very important. An improper latch can cause nipple pain and decreased milk supply for you and poor weight gain in your baby. Also, if your baby is not latched onto your nipple properly, he or she may swallow some air during feeding. This can make your baby fussy. Burping your baby when you switch breasts during the feeding can help to get rid of the air. However, teaching your baby to latch on properly is still the best way to prevent fussiness from swallowing air while breastfeeding. Signs that your baby has successfully latched on to your nipple:  Silent tugging or silent sucking, without causing you pain.  Swallowing heard between every 3-4 sucks.  Muscle movement above and in front of his or her ears while sucking.  Signs that your baby has not successfully latched on to nipple:  Sucking sounds or smacking sounds from your baby while breastfeeding.  Nipple pain.  If you think your baby has not latched on correctly, slip your finger into the corner of your baby's mouth to break the suction and place it  between your baby's gums. Attempt breastfeeding initiation again. Signs of Successful Breastfeeding Signs from your baby:  A gradual decrease in the number of sucks or complete cessation of sucking.  Falling asleep.  Relaxation of his or her body.  Retention of a small amount of milk in his or her mouth.  Letting go of your breast  by himself or herself.  Signs from you:  Breasts that have increased in firmness, weight, and size 1-3 hours after feeding.  Breasts that are softer immediately after breastfeeding.  Increased milk volume, as well as a change in milk consistency and color by the fifth day of breastfeeding.  Nipples that are not sore, cracked, or bleeding.  Signs That Your Pecola LeisureBaby is Getting Enough Milk  Wetting at least 1-2 diapers during the first 24 hours after birth.  Wetting at least 5-6 diapers every 24 hours for the first week after birth. The urine should be clear or pale yellow by 5 days after birth.  Wetting 6-8 diapers every 24 hours as your baby continues to grow and develop.  At least 3 stools in a 24-hour period by age 804 days. The stool should be soft and yellow.  At least 3 stools in a 24-hour period by age 75 days. The stool should be seedy and yellow.  No loss of weight greater than 10% of birth weight during the first 643 days of age.  Average weight gain of 4-7 ounces (113-198 g) per week after age 80 days.  Consistent daily weight gain by age 804 days, without weight loss after the age of 2 weeks.  After a feeding, your baby may spit up a small amount. This is common. Breastfeeding frequency and duration Frequent feeding will help you make more milk and can prevent sore nipples and breast engorgement. Breastfeed when you feel the need to reduce the fullness of your breasts or when your baby shows signs of hunger. This is called "breastfeeding on demand." Avoid introducing a pacifier to your baby while you are working to establish breastfeeding (the  first 4-6 weeks after your baby is born). After this time you may choose to use a pacifier. Research has shown that pacifier use during the first year of a baby's life decreases the risk of sudden infant death syndrome (SIDS). Allow your baby to feed on each breast as long as he or she wants. Breastfeed until your baby is finished feeding. When your baby unlatches or falls asleep while feeding from the first breast, offer the second breast. Because newborns are often sleepy in the first few weeks of life, you may need to awaken your baby to get him or her to feed. Breastfeeding times will vary from baby to baby. However, the following rules can serve as a guide to help you ensure that your baby is properly fed:  Newborns (babies 294 weeks of age or younger) may breastfeed every 1-3 hours.  Newborns should not go longer than 3 hours during the day or 5 hours during the night without breastfeeding.  You should breastfeed your baby a minimum of 8 times in a 24-hour period until you begin to introduce solid foods to your baby at around 1086 months of age.  Breast milk pumping Pumping and storing breast milk allows you to ensure that your baby is exclusively fed your breast milk, even at times when you are unable to breastfeed. This is especially important if you are going back to work while you are still breastfeeding or when you are not able to be present during feedings. Your lactation consultant can give you guidelines on how long it is safe to store breast milk. A breast pump is a machine that allows you to pump milk from your breast into a sterile bottle. The pumped breast milk can then be stored in a refrigerator or freezer. Some breast  pumps are operated by hand, while others use electricity. Ask your lactation consultant which type will work best for you. Breast pumps can be purchased, but some hospitals and breastfeeding support groups lease breast pumps on a monthly basis. A lactation consultant can  teach you how to hand express breast milk, if you prefer not to use a pump. Caring for your breasts while you breastfeed Nipples can become dry, cracked, and sore while breastfeeding. The following recommendations can help keep your breasts moisturized and healthy:  Avoid using soap on your nipples.  Wear a supportive bra. Although not required, special nursing bras and tank tops are designed to allow access to your breasts for breastfeeding without taking off your entire bra or top. Avoid wearing underwire-style bras or extremely tight bras.  Air dry your nipples for 3-754minutes after each feeding.  Use only cotton bra pads to absorb leaked breast milk. Leaking of breast milk between feedings is normal.  Use lanolin on your nipples after breastfeeding. Lanolin helps to maintain your skin's normal moisture barrier. If you use pure lanolin, you do not need to wash it off before feeding your baby again. Pure lanolin is not toxic to your baby. You may also hand express a few drops of breast milk and gently massage that milk into your nipples and allow the milk to air dry.  In the first few weeks after giving birth, some women experience extremely full breasts (engorgement). Engorgement can make your breasts feel heavy, warm, and tender to the touch. Engorgement peaks within 3-5 days after you give birth. The following recommendations can help ease engorgement:  Completely empty your breasts while breastfeeding or pumping. You may want to start by applying warm, moist heat (in the shower or with warm water-soaked hand towels) just before feeding or pumping. This increases circulation and helps the milk flow. If your baby does not completely empty your breasts while breastfeeding, pump any extra milk after he or she is finished.  Wear a snug bra (nursing or regular) or tank top for 1-2 days to signal your body to slightly decrease milk production.  Apply ice packs to your breasts, unless this is too  uncomfortable for you.  Make sure that your baby is latched on and positioned properly while breastfeeding.  If engorgement persists after 48 hours of following these recommendations, contact your health care provider or a Advertising copywriterlactation consultant. Overall health care recommendations while breastfeeding  Eat healthy foods. Alternate between meals and snacks, eating 3 of each per day. Because what you eat affects your breast milk, some of the foods may make your baby more irritable than usual. Avoid eating these foods if you are sure that they are negatively affecting your baby.  Drink milk, fruit juice, and water to satisfy your thirst (about 10 glasses a day).  Rest often, relax, and continue to take your prenatal vitamins to prevent fatigue, stress, and anemia.  Continue breast self-awareness checks.  Avoid chewing and smoking tobacco. Chemicals from cigarettes that pass into breast milk and exposure to secondhand smoke may harm your baby.  Avoid alcohol and drug use, including marijuana. Some medicines that may be harmful to your baby can pass through breast milk. It is important to ask your health care provider before taking any medicine, including all over-the-counter and prescription medicine as well as vitamin and herbal supplements. It is possible to become pregnant while breastfeeding. If birth control is desired, ask your health care provider about options that will be safe  for your baby. Contact a health care provider if:  You feel like you want to stop breastfeeding or have become frustrated with breastfeeding.  You have painful breasts or nipples.  Your nipples are cracked or bleeding.  Your breasts are red, tender, or warm.  You have a swollen area on either breast.  You have a fever or chills.  You have nausea or vomiting.  You have drainage other than breast milk from your nipples.  Your breasts do not become full before feedings by the fifth day after you give  birth.  You feel sad and depressed.  Your baby is too sleepy to eat well.  Your baby is having trouble sleeping.  Your baby is wetting less than 3 diapers in a 24-hour period.  Your baby has less than 3 stools in a 24-hour period.  Your baby's skin or the white part of his or her eyes becomes yellow.  Your baby is not gaining weight by 785 days of age. Get help right away if:  Your baby is overly tired (lethargic) and does not want to wake up and feed.  Your baby develops an unexplained fever. This information is not intended to replace advice given to you by your health care provider. Make sure you discuss any questions you have with your health care provider. Document Released: 02/19/2005 Document Revised: 08/03/2015 Document Reviewed: 08/13/2012 Elsevier Interactive Patient Education  2017 [employer]

## 2017-01-21 NOTE — Addendum Note (Signed)
Addended by: Dalphine HandingGARDNER, Yao [place]pp[place] L on: 01/21/2017 09:58 AM   Modules accepted: Orders

## 2017-01-21 NOTE — Progress Notes (Signed)
Pt c/o migraines and N/V. No relief with Tylenol.

## 2017-01-21 NOTE — Progress Notes (Signed)
  Subjective:    Alexandra Hart is a G1P0 1355w2d being seen today for her first obstetrical visit.  Her obstetrical history is significant for first pregnancy. Patient does intend to breast feed. Pregnancy history fully reviewed.  Patient reports nausea and vomiting.  Vitals:   01/21/17 0911  BP: 115/71  Pulse: 79  Weight: 144 lb (65.3 kg)    HISTORY: OB History  Gravida Para Term Preterm AB Living  1            SAB TAB Ectopic Multiple Live Births               # Outcome Date GA Lbr Len/2nd Weight Sex Delivery Anes PTL Lv  1 Current              Past Medical History:  Diagnosis Date  . Headache   . Vaginal Pap smear, abnormal    History reviewed. No pertinent surgical history. Family History  Problem Relation Age of Onset  . Varicose Veins Mother   . Miscarriages / Stillbirths Sister   . Asthma Maternal Grandmother   . Diabetes Maternal Grandmother   . Hypertension Maternal Grandmother   . Varicose Veins Maternal Grandmother      Exam    Uterus:   12-weeks  Pelvic Exam:    Perineum: No Hemorrhoids, Normal Perineum   Vulva: normal   Vagina:  normal mucosa, normal discharge   pH:    Cervix: nulliparous appearance   Adnexa: normal adnexa and no mass, fullness, tenderness   Bony Pelvis: gynecoid  System: Breast:  normal appearance, no masses or tenderness   Skin: normal coloration and turgor, no rashes    Neurologic: oriented, no focal deficits   Extremities: normal strength, tone, and muscle mass   HEENT extra ocular movement intact   Mouth/Teeth mucous membranes moist, pharynx normal without lesions and dental hygiene good   Neck supple and no masses   Cardiovascular: regular rate and rhythm   Respiratory:  chest clear, no wheezing, crepitations, rhonchi, normal symmetric air entry   Abdomen: soft, non-tender; bowel sounds normal; no masses,  no organomegaly   Urinary:       Assessment:    Pregnancy: G1P0 Patient Active Problem List   Diagnosis  Date Noted  . Supervision of normal first pregnancy, antepartum 12/27/2016        Plan:     Initial labs drawn. Prenatal vitamins. Problem list reviewed and updated. Genetic Screening discussed First Screen: ordered.  Ultrasound discussed; fetal survey: requested.  Follow up in 4 weeks. 50% of 30 min visit spent on counseling and coordination of care.     Alexandra Hart 01/21/2017

## 2017-01-22 ENCOUNTER — Encounter (HOSPITAL_COMMUNITY): Payer: Self-pay | Admitting: Obstetrics and Gynecology

## 2017-01-22 LAB — CERVICOVAGINAL ANCILLARY ONLY
Bacterial vaginitis: NEGATIVE
CANDIDA VAGINITIS: NEGATIVE
CHLAMYDIA, DNA PROBE: NEGATIVE
Neisseria Gonorrhea: NEGATIVE
TRICH (WINDOWPATH): NEGATIVE

## 2017-01-22 LAB — CYTOLOGY - PAP: Diagnosis: NEGATIVE

## 2017-01-23 LAB — OBSTETRIC PANEL, INCLUDING HIV
ANTIBODY SCREEN: NEGATIVE
Basophils Absolute: 0 10*3/uL (ref 0.0–0.2)
Basos: 0 %
EOS (ABSOLUTE): 0.1 10*3/uL (ref 0.0–0.4)
EOS: 2 %
HEMATOCRIT: 38.2 % (ref 34.0–46.6)
HEMOGLOBIN: 12.9 g/dL (ref 11.1–15.9)
HIV Screen 4th Generation wRfx: NONREACTIVE
Hepatitis B Surface Ag: NEGATIVE
IMMATURE GRANS (ABS): 0 10*3/uL (ref 0.0–0.1)
Immature Granulocytes: 0 %
Lymphocytes Absolute: 2.3 10*3/uL (ref 0.7–3.1)
Lymphs: 33 %
MCH: 29.7 pg (ref 26.6–33.0)
MCHC: 33.8 g/dL (ref 31.5–35.7)
MCV: 88 fL (ref 79–97)
MONOS ABS: 0.4 10*3/uL (ref 0.1–0.9)
Monocytes: 6 %
Neutrophils Absolute: 4 10*3/uL (ref 1.4–7.0)
Neutrophils: 59 %
Platelets: 239 10*3/uL (ref 150–379)
RBC: 4.35 x10E6/uL (ref 3.77–5.28)
RDW: 13.6 % (ref 12.3–15.4)
RPR Ser Ql: NONREACTIVE
Rh Factor: POSITIVE
Rubella Antibodies, IGG: 4.92 index (ref >0.99)
WBC: 6.8 10*3/uL (ref 3.4–10.8)

## 2017-01-23 LAB — HEMOGLOBINOPATHY EVALUATION
HEMOGLOBIN A2 QUANTITATION: 2.4 % (ref 1.8–3.2)
HGB C: 0 %
HGB S: 0 %
HGB VARIANT: 0 %
Hemoglobin F Quantitation: 0 % (ref 0.0–2.0)
Hgb A: 97.6 % (ref 96.4–98.8)

## 2017-01-23 LAB — CULTURE, OB URINE

## 2017-01-23 LAB — URINE CULTURE, OB REFLEX

## 2017-01-29 ENCOUNTER — Ambulatory Visit (HOSPITAL_COMMUNITY)
Admission: RE | Admit: 2017-01-29 | Discharge: 2017-01-29 | Disposition: A | Discharge status: Home / Self Care | Payer: Federal, State, Local not specified - PPO | Source: Ambulatory Visit | Attending: Obstetrics and Gynecology | Admitting: Obstetrics and Gynecology

## 2017-01-29 ENCOUNTER — Other Ambulatory Visit: Payer: Self-pay | Admitting: Obstetrics and Gynecology

## 2017-01-29 ENCOUNTER — Encounter (HOSPITAL_COMMUNITY): Payer: Self-pay

## 2017-01-29 DIAGNOSIS — Z3401 Encounter for supervision of normal first pregnancy, first trimester: Secondary | ICD-10-CM | POA: Insufficient documentation

## 2017-01-29 DIAGNOSIS — Z3682 Encounter for antenatal screening for nuchal translucency: Secondary | ICD-10-CM

## 2017-01-29 DIAGNOSIS — Z3400 Encounter for supervision of normal first pregnancy, unspecified trimester: Secondary | ICD-10-CM

## 2017-01-29 DIAGNOSIS — O281 Abnormal biochemical finding on antenatal screening of mother: Secondary | ICD-10-CM | POA: Diagnosis not present

## 2017-01-29 DIAGNOSIS — Z3A12 12 weeks gestation of pregnancy: Secondary | ICD-10-CM | POA: Insufficient documentation

## 2017-01-29 LAB — CYSTIC FIBROSIS MUTATION 97: Interpretation: NOT DETECTED

## 2017-01-30 LAB — SMN1 COPY NUMBER ANALYSIS (SMA CARRIER SCREENING)

## 2017-02-14 ENCOUNTER — Other Ambulatory Visit (HOSPITAL_COMMUNITY): Payer: Self-pay

## 2017-02-18 ENCOUNTER — Telehealth: Payer: Self-pay | Admitting: *Deleted

## 2017-02-18 ENCOUNTER — Encounter: Payer: Self-pay | Admitting: Obstetrics and Gynecology

## 2017-02-18 ENCOUNTER — Ambulatory Visit (INDEPENDENT_AMBULATORY_CARE_PROVIDER_SITE_OTHER): Payer: Federal, State, Local not specified - PPO | Admitting: Obstetrics and Gynecology

## 2017-02-18 DIAGNOSIS — Z3400 Encounter for supervision of normal first pregnancy, unspecified trimester: Secondary | ICD-10-CM | POA: Diagnosis not present

## 2017-02-18 MED ORDER — PRENATAL ADULT GUMMY/DHA/FA 0.4-25 MG PO CHEW: 2.0000 | CHEWABLE_TABLET | Freq: Every day | ORAL | 5 refills | Status: DC

## 2017-02-18 NOTE — Progress Notes (Signed)
   PRENATAL VISIT NOTE  Subjective:  Alexandra Hart is a 26 y.o. G1P0 at 2421w2d being seen today for ongoing prenatal care.  She is currently monitored for the following issues for this low-risk pregnancy and has Supervision of normal first pregnancy, antepartum on their problem list.  Patient reports no complaints.  Contractions: Not present. Vag. Bleeding: None.  Movement: Present. Denies leaking of fluid.   The following portions of the patient's history were reviewed and updated as appropriate: allergies, current medications, past family history, past medical history, past social history, past surgical history and problem list. Problem list updated.  Objective:   Vitals:   02/18/17 1012  BP: (!) 114/59  Pulse: 73  Weight: 146 lb (66.2 kg)    Fetal Status: Fetal Heart Rate (bpm): 152   Movement: Present     General:  Alert, oriented and cooperative. Patient is in no acute distress.  Skin: Skin is warm and dry. No rash noted.   Cardiovascular: Normal heart rate noted  Respiratory: Normal respiratory effort, no problems with respiration noted  Abdomen: Soft, gravid, appropriate for gestational age.  Pain/Pressure: Absent     Pelvic: Cervical exam deferred        Extremities: Normal range of motion.  Edema: None  Mental Status:  Normal mood and affect. Normal behavior. Normal judgment and thought content.   Assessment and Plan:  Pregnancy: G1P0 at 9221w2d  1. Supervision of normal first pregnancy, antepartum Patient is doing well without complaints Quad screen today Anatomy ultrasound ordered - US MFM OB COMP + 14 WK; Future - AFP TETRA  Preterm labor symptoms and general obstetric precautions including but not limited to vaginal bleeding, contractions, leaking of fluid and fetal movement were reviewed in detail with the patient. Please refer to After Visit Summary for other counseling recommendations.  Return in about 4 weeks (around 03/18/2017) for ROB.   Catalina AntiguaPeggy Yetunde Leis,  MD

## 2017-02-18 NOTE — Telephone Encounter (Signed)
Call placed to pt to make her aware that the PNV that was sent in is not covered by her insurance. Pt was made aware that she may call pharmacy and check her copay and/or cash price. Pt made aware if Rx to expensive she may try OTC PNV or may take Flinstone vitamin.  Pt states she will check with pharmacy and make us aware if she has any other needs.

## 2017-02-21 LAB — AFP TETRA
DIA Mom Value: 0.73
DIA VALUE (EIA): 134.07 pg/mL
DSR (By Age)    1 IN: 1035
DSR (SECOND TRIMESTER) 1 IN: 9256
Gestational Age: 15.3 WEEKS
MSAFP Mom: 1.57
MSAFP: 49.2 ng/mL
MSHCG Mom: 0.3
MSHCG: 14609 m[IU]/mL
Maternal Age At EDD: 24.9 yr
OSB RISK: 4541
Test Results:: NEGATIVE
UE3 VALUE: 0.44 ng/mL
WEIGHT: 146 [lb_av]
uE3 Mom: 0.7

## 2017-02-27 ENCOUNTER — Encounter: Payer: Self-pay | Admitting: *Deleted

## 2017-03-05 NOTE — L&D Delivery Note (Signed)
Patient is a 27 y.o. now G1P1 s/p NSVD at 7449w6d, who was admitted for SOL.  She progressed without augmentation to complete and pushed 18 minutes to deliver in water birth tub.  Cord clamping delayed by several minutes then clamped by CNM and cut by FOB.  Placenta intact and spontaneous, bleeding minimal.  Bilateral labial laceration identified- not repaired, hemostatic.  Mom and baby stable prior to transfer to postpartum. She plans on breastfeeding. She is undecided about method for birth control.  Delivery Note At 11:45 AM a viable and healthy female was delivered via Vaginal, Spontaneous (Presentation: LOA).  APGAR: 8, 9; weight pending .   Placenta intact and spontaneous, bleeding minimal. 3VCord:  with no complications.  Anesthesia: None Episiotomy: None Lacerations: Labial Suture Repair: None Est. Blood Loss (mL): 200  Mom to postpartum.  Baby to Couplet care / Skin to Skin.  Sharyon CableVeronica C Aviona Martenson CNM 08/09/2017, 12:06 PM

## 2017-03-12 ENCOUNTER — Other Ambulatory Visit: Payer: Self-pay | Admitting: Obstetrics and Gynecology

## 2017-03-12 ENCOUNTER — Ambulatory Visit (HOSPITAL_COMMUNITY)
Admission: RE | Admit: 2017-03-12 | Discharge: 2017-03-12 | Disposition: A | Discharge status: Home / Self Care | Payer: Federal, State, Local not specified - PPO | Source: Ambulatory Visit | Attending: Obstetrics and Gynecology | Admitting: Obstetrics and Gynecology

## 2017-03-12 DIAGNOSIS — Z3689 Encounter for other specified antenatal screening: Secondary | ICD-10-CM | POA: Diagnosis not present

## 2017-03-12 DIAGNOSIS — Z3400 Encounter for supervision of normal first pregnancy, unspecified trimester: Secondary | ICD-10-CM

## 2017-03-12 DIAGNOSIS — Z3A18 18 weeks gestation of pregnancy: Secondary | ICD-10-CM | POA: Insufficient documentation

## 2017-03-18 ENCOUNTER — Encounter: Payer: Self-pay | Admitting: Obstetrics and Gynecology

## 2017-03-18 ENCOUNTER — Ambulatory Visit (INDEPENDENT_AMBULATORY_CARE_PROVIDER_SITE_OTHER): Payer: Federal, State, Local not specified - PPO | Admitting: Obstetrics and Gynecology

## 2017-03-18 DIAGNOSIS — Z3402 Encounter for supervision of normal first pregnancy, second trimester: Secondary | ICD-10-CM

## 2017-03-18 DIAGNOSIS — Z3400 Encounter for supervision of normal first pregnancy, unspecified trimester: Secondary | ICD-10-CM

## 2017-03-18 NOTE — Progress Notes (Signed)
   PRENATAL VISIT NOTE  Subjective:  Alexandra Hart is a 27 y.o. G1P0 at 555w2d being seen today for ongoing prenatal care.  She is currently monitored for the following issues for this low-risk pregnancy and has Supervision of normal first pregnancy, antepartum on their problem list.  Patient reports no complaints.  Contractions: Not present. Vag. Bleeding: None.  Movement: Present. Denies leaking of fluid.   The following portions of the patient's history were reviewed and updated as appropriate: allergies, current medications, past family history, past medical history, past social history, past surgical history and problem list. Problem list updated.  Objective:   Vitals:   03/18/17 1035  BP: 114/72  Pulse: 81  Weight: 152 lb 6.4 oz (69.1 kg)    Fetal Status: Fetal Heart Rate (bpm): 156 Fundal Height: 20 cm Movement: Present     General:  Alert, oriented and cooperative. Patient is in no acute distress.  Skin: Skin is warm and dry. No rash noted.   Cardiovascular: Normal heart rate noted  Respiratory: Normal respiratory effort, no problems with respiration noted  Abdomen: Soft, gravid, appropriate for gestational age.  Pain/Pressure: Absent     Pelvic: Cervical exam deferred        Extremities: Normal range of motion.  Edema: None  Mental Status:  Normal mood and affect. Normal behavior. Normal judgment and thought content.   Assessment and Plan:  Pregnancy: G1P0 at 3555w2d  1. Supervision of normal first pregnancy, antepartum Patient is doing well without complaints Follow up anatomy ultrasound ordered Patient planning on eliminating red meat from her diet. She is still planning on consuming poultry and fish - US MFM OB FOLLOW UP; Future  Preterm labor symptoms and general obstetric precautions including but not limited to vaginal bleeding, contractions, leaking of fluid and fetal movement were reviewed in detail with the patient. Please refer to After Visit Summary for other  counseling recommendations.  Return in about 4 weeks (around 04/15/2017) for ROB.   Catalina AntiguaPeggy Crystalyn Delia, MD

## 2017-04-15 ENCOUNTER — Ambulatory Visit (INDEPENDENT_AMBULATORY_CARE_PROVIDER_SITE_OTHER): Payer: Federal, State, Local not specified - PPO | Admitting: Obstetrics & Gynecology

## 2017-04-15 VITALS — BP 116/74 | HR 73 | Wt 155.3 lb

## 2017-04-15 DIAGNOSIS — Z3402 Encounter for supervision of normal first pregnancy, second trimester: Secondary | ICD-10-CM

## 2017-04-15 DIAGNOSIS — O26892 Other specified pregnancy related conditions, second trimester: Secondary | ICD-10-CM

## 2017-04-15 DIAGNOSIS — Z3400 Encounter for supervision of normal first pregnancy, unspecified trimester: Secondary | ICD-10-CM

## 2017-04-15 DIAGNOSIS — R51 Headache: Secondary | ICD-10-CM

## 2017-04-15 MED ORDER — BUTALBITAL-APAP-CAFFEINE 50-325-40 MG PO CAPS: 1.0000 | ORAL_CAPSULE | Freq: Four times a day (QID) | ORAL | 3 refills | Status: DC | PRN

## 2017-04-15 NOTE — Progress Notes (Signed)
   PRENATAL VISIT NOTE  Subjective:  Alexandra Hart is a 27 y.o. G1P0 at 6613w2d being seen today for ongoing prenatal care.  She is currently monitored for the following issues for this low-risk pregnancy and has Supervision of normal first pregnancy, antepartum on their problem list.  Patient reports no complaints.  Contractions: Not present. Vag. Bleeding: None.  Movement: Present. Denies leaking of fluid.   The following portions of the patient's history were reviewed and updated as appropriate: allergies, current medications, past family history, past medical history, past social history, past surgical history and problem list. Problem list updated.  Objective:   Vitals:   04/15/17 1052  BP: 116/74  Pulse: 73  Weight: 155 lb 4.8 oz (70.4 kg)    Fetal Status: Fetal Heart Rate (bpm): 140 Fundal Height: 23 cm Movement: Present     General:  Alert, oriented and cooperative. Patient is in no acute distress.  Skin: Skin is warm and dry. No rash noted.   Cardiovascular: Normal heart rate noted  Respiratory: Normal respiratory effort, no problems with respiration noted  Abdomen: Soft, gravid, appropriate for gestational age.  Pain/Pressure: Absent     Pelvic: Cervical exam deferred        Extremities: Normal range of motion.  Edema: Trace  Mental Status:  Normal mood and affect. Normal behavior. Normal judgment and thought content.   Assessment and Plan:  Pregnancy: G1P0 at 4413w2d  1. Headache in pregnancy, antepartum, second trimester Will try Fioricet and observe response. Precautions given to patient.  - Butalbital-APAP-Caffeine 50-325-40 MG capsule; Take 1-2 capsules by mouth every 6 (six) hours as needed for headache.  Dispense: 30 capsule; Refill: 3  2. Supervision of normal first pregnancy, antepartum Preterm labor symptoms and general obstetric precautions including but not limited to vaginal bleeding, contractions, leaking of fluid and fetal movement were reviewed in detail  with the patient. Please refer to After Visit Summary for other counseling recommendations.  Return in about 4 weeks (around 05/13/2017) for 2 hr GTT, 3rd trimester labs, TDap, OB Visit.   Jaynie CollinsUgonna Abdirizak Richison, MD

## 2017-04-15 NOTE — Patient Instructions (Addendum)
Return to clinic for any scheduled appointments or obstetric concerns, or go to [facility] for evaluation  TDaP Vaccine Pregnancy Get the Whooping Cough Vaccine While You Are Pregnant (CDC)  It is important for women to get the whooping cough vaccine in the third trimester of each pregnancy. Vaccines are the best way to prevent this disease. There are 2 different whooping cough vaccines. Both vaccines combine protection against whooping cough, tetanus and diphtheria, but they are for different age groups: Tdap: for everyone 11 years or older, including pregnant women  DTaP: for children 2 months through 6 years of age  You need the whooping cough vaccine during each of your pregnancies The recommended time to get the shot is during your 27th through 36th week of pregnancy, preferably during the earlier part of this time period. The Centers for Disease Control and Prevention (CDC) recommends that pregnant women receive the whooping cough vaccine for adolescents and adults (called Tdap vaccine) during the third trimester of each pregnancy. The recommended time to get the shot is during your 27th through 36th week of pregnancy, preferably during the earlier part of this time period. This replaces the original recommendation that pregnant women get the vaccine only if they had not previously received it. The American College of Obstetricians and Gynecologists and the American College of Nurse-Midwives support this recommendation.  You should get the whooping cough vaccine while pregnant to pass protection to your baby frame support disabled and/or not supported in this browser  Learn why Laura decided to get the whooping cough vaccine in her 3rd trimester of pregnancy and how her baby girl was born with some protection against the disease. Also available on YouTube. After receiving the whooping cough vaccine, your body will create protective antibodies (proteins produced by the body to fight off diseases) and  pass some of them to your baby before birth. These antibodies provide your baby some short-term protection against whooping cough in early life. These antibodies can also protect your baby from some of the more serious complications that come along with whooping cough. Your protective antibodies are at their highest about 2 weeks after getting the vaccine, but it takes time to pass them to your baby. So the preferred time to get the whooping cough vaccine is early in your third trimester. The amount of whooping cough antibodies in your body decreases over time. That is why CDC recommends you get a whooping cough vaccine during each pregnancy. Doing so allows each of your babies to get the greatest number of protective antibodies from you. This means each of your babies will get the best protection possible against this disease.  Getting the whooping cough vaccine while pregnant is better than getting the vaccine after you give birth Whooping cough vaccination during pregnancy is ideal so your baby will have short-term protection as soon as he is born. This early protection is important because your baby will not start getting his whooping cough vaccines until he is 2 months old. These first few months of life are when your baby is at greatest risk for catching whooping cough. This is also when he's at greatest risk for having severe, potentially life-threating complications from the infection. To avoid that gap in protection, it is best to get a whooping cough vaccine during pregnancy. You will then pass protection to your baby before he is born. To continue protecting your baby, he should get whooping cough vaccines starting at 2 months old. You may never have gotten the   Tdap vaccine before and did not get it during this pregnancy. If so, you should make sure to get the vaccine immediately after you give birth, before leaving the hospital or birthing center. It will take about 2 weeks before your body  develops protection (antibodies) in response to the vaccine. Once you have protection from the vaccine, you are less likely to give whooping cough to your newborn while caring for him. But remember, your baby will still be at risk for catching whooping cough from others. A recent study looked to see how effective Tdap was at preventing whooping cough in babies whose mothers got the vaccine while pregnant or in the hospital after giving birth. The study found that getting Tdap between 27 through 36 weeks of pregnancy is 85% more effective at preventing whooping cough in babies younger than 2 months old. Blood tests cannot tell if you need a whooping cough vaccine There are no blood tests that can tell you if you have enough antibodies in your body to protect yourself or your baby against whooping cough. Even if you have been sick with whooping cough in the past or previously received the vaccine, you still should get the vaccine during each pregnancy. Breastfeeding may pass some protective antibodies onto your baby By breastfeeding, you may pass some antibodies you have made in response to the vaccine to your baby. When you get a whooping cough vaccine during your pregnancy, you will have antibodies in your breast milk that you can share with your baby as soon as your milk comes in. However, your baby will not get protective antibodies immediately if you wait to get the whooping cough vaccine until after delivering your baby. This is because it takes about 2 weeks for your body to create antibodies. Learn more about the health benefits of breastfeeding. 

## 2017-04-26 ENCOUNTER — Ambulatory Visit (HOSPITAL_COMMUNITY)
Admission: RE | Admit: 2017-04-26 | Discharge: 2017-04-26 | Disposition: A | Discharge status: Home / Self Care | Payer: Federal, State, Local not specified - PPO | Source: Ambulatory Visit | Attending: Obstetrics and Gynecology | Admitting: Obstetrics and Gynecology

## 2017-04-26 ENCOUNTER — Other Ambulatory Visit: Payer: Self-pay | Admitting: Obstetrics and Gynecology

## 2017-04-26 DIAGNOSIS — Z3402 Encounter for supervision of normal first pregnancy, second trimester: Secondary | ICD-10-CM | POA: Diagnosis not present

## 2017-04-26 DIAGNOSIS — Z3400 Encounter for supervision of normal first pregnancy, unspecified trimester: Secondary | ICD-10-CM

## 2017-04-26 DIAGNOSIS — Z362 Encounter for other antenatal screening follow-up: Secondary | ICD-10-CM

## 2017-04-26 DIAGNOSIS — Z3A24 24 weeks gestation of pregnancy: Secondary | ICD-10-CM

## 2017-05-13 ENCOUNTER — Encounter: Payer: Self-pay | Admitting: *Deleted

## 2017-05-13 ENCOUNTER — Other Ambulatory Visit (INDEPENDENT_AMBULATORY_CARE_PROVIDER_SITE_OTHER): Payer: Federal, State, Local not specified - PPO

## 2017-05-13 ENCOUNTER — Encounter: Payer: Self-pay | Admitting: Obstetrics and Gynecology

## 2017-05-13 ENCOUNTER — Ambulatory Visit (INDEPENDENT_AMBULATORY_CARE_PROVIDER_SITE_OTHER): Payer: Federal, State, Local not specified - PPO | Admitting: Obstetrics and Gynecology

## 2017-05-13 VITALS — BP 129/79 | HR 80 | Wt 156.4 lb

## 2017-05-13 DIAGNOSIS — Z3402 Encounter for supervision of normal first pregnancy, second trimester: Secondary | ICD-10-CM

## 2017-05-13 DIAGNOSIS — Z3483 Encounter for supervision of other normal pregnancy, third trimester: Secondary | ICD-10-CM

## 2017-05-13 DIAGNOSIS — Z3400 Encounter for supervision of normal first pregnancy, unspecified trimester: Secondary | ICD-10-CM

## 2017-05-13 DIAGNOSIS — Z23 Encounter for immunization: Secondary | ICD-10-CM

## 2017-05-13 NOTE — Progress Notes (Signed)
   PRENATAL VISIT NOTE  Subjective:  Alexandra Hart is a 27 y.o. G1P0 at 382w2d being seen today for ongoing prenatal care.  She is currently monitored for the following issues for this low-risk pregnancy and has Supervision of normal first pregnancy, antepartum on their problem list.  Patient reports no complaints.  Contractions: Not present. Vag. Bleeding: None.  Movement: Present. Denies leaking of fluid.   The following portions of the patient's history were reviewed and updated as appropriate: allergies, current medications, past family history, past medical history, past social history, past surgical history and problem list. Problem list updated.  Objective:   Vitals:   05/13/17 0917  BP: 129/79  Pulse: 80  Weight: 156 lb 6.4 oz (70.9 kg)    Fetal Status: Fetal Heart Rate (bpm): 145 Fundal Height: 27 cm Movement: Present     General:  Alert, oriented and cooperative. Patient is in no acute distress.  Skin: Skin is warm and dry. No rash noted.   Cardiovascular: Normal heart rate noted  Respiratory: Normal respiratory effort, no problems with respiration noted  Abdomen: Soft, gravid, appropriate for gestational age.  Pain/Pressure: Absent     Pelvic: Cervical exam deferred        Extremities: Normal range of motion.  Edema: Trace  Mental Status:  Normal mood and affect. Normal behavior. Normal judgment and thought content.   Assessment and Plan:  Pregnancy: G1P0 at 3082w2d  1. Supervision of normal first pregnancy, antepartum Patient is doing well without complaints She reports improvement in her headaches with Fioricet Third trimester labs and tdap today Follow up growth ultrasound ordered - US MFM OB FOLLOW UP; Future - Glucose Tolerance, 2 Hours w/1 Hour - CBC - HIV antibody - RPR - Tdap vaccine greater than or equal to 7yo IM  Preterm labor symptoms and general obstetric precautions including but not limited to vaginal bleeding, contractions, leaking of fluid and  fetal movement were reviewed in detail with the patient. Please refer to After Visit Summary for other counseling recommendations.  Return in about 2 weeks (around 05/27/2017) for ROB.   Catalina AntiguaPeggy Vaness Jelinski, MD

## 2017-05-14 LAB — GLUCOSE TOLERANCE, 2 HOURS W/ 1HR
GLUCOSE, 1 HOUR: 117 mg/dL (ref 65–179)
Glucose, 2 hour: 104 mg/dL (ref 65–152)
Glucose, Fasting: 75 mg/dL (ref 65–91)

## 2017-05-14 LAB — CBC
HEMATOCRIT: 37.1 % (ref 34.0–46.6)
HEMOGLOBIN: 12.3 g/dL (ref 11.1–15.9)
MCH: 29.3 pg (ref 26.6–33.0)
MCHC: 33.2 g/dL (ref 31.5–35.7)
MCV: 88 fL (ref 79–97)
Platelets: 199 10*3/uL (ref 150–379)
RBC: 4.2 x10E6/uL (ref 3.77–5.28)
RDW: 14.4 % (ref 12.3–15.4)
WBC: 5.5 10*3/uL (ref 3.4–10.8)

## 2017-05-14 LAB — RPR: RPR: NONREACTIVE

## 2017-05-14 LAB — HIV ANTIBODY (ROUTINE TESTING W REFLEX): HIV SCREEN 4TH GENERATION: NONREACTIVE

## 2017-05-24 ENCOUNTER — Ambulatory Visit (INDEPENDENT_AMBULATORY_CARE_PROVIDER_SITE_OTHER): Payer: Federal, State, Local not specified - PPO | Admitting: Certified Nurse Midwife

## 2017-05-24 ENCOUNTER — Other Ambulatory Visit: Payer: Self-pay | Admitting: Obstetrics and Gynecology

## 2017-05-24 ENCOUNTER — Encounter: Payer: Self-pay | Admitting: Certified Nurse Midwife

## 2017-05-24 ENCOUNTER — Ambulatory Visit (HOSPITAL_COMMUNITY)
Admission: RE | Admit: 2017-05-24 | Discharge: 2017-05-24 | Disposition: A | Discharge status: Home / Self Care | Payer: Federal, State, Local not specified - PPO | Source: Ambulatory Visit | Attending: Obstetrics and Gynecology | Admitting: Obstetrics and Gynecology

## 2017-05-24 VITALS — BP 120/78 | HR 91 | Wt 160.8 lb

## 2017-05-24 DIAGNOSIS — Z3403 Encounter for supervision of normal first pregnancy, third trimester: Secondary | ICD-10-CM | POA: Insufficient documentation

## 2017-05-24 DIAGNOSIS — Z3A28 28 weeks gestation of pregnancy: Secondary | ICD-10-CM | POA: Insufficient documentation

## 2017-05-24 DIAGNOSIS — Z3400 Encounter for supervision of normal first pregnancy, unspecified trimester: Secondary | ICD-10-CM

## 2017-05-24 DIAGNOSIS — Z362 Encounter for other antenatal screening follow-up: Secondary | ICD-10-CM | POA: Insufficient documentation

## 2017-05-24 NOTE — Progress Notes (Signed)
   PRENATAL VISIT NOTE  Subjective:  Alexandra Hart is a 27 y.o. G1P0 at 1272w6d being seen today for ongoing prenatal care.  She is currently monitored for the following issues for this low-risk pregnancy and has Supervision of normal first pregnancy, antepartum on their problem list.  Patient reports no complaints.  Contractions: Not present. Vag. Bleeding: None.  Movement: Present. Denies leaking of fluid.   The following portions of the patient's history were reviewed and updated as appropriate: allergies, current medications, past family history, past medical history, past social history, past surgical history and problem list. Problem list updated.  Objective:   Vitals:   05/24/17 0924  BP: 120/78  Pulse: 91  Weight: 160 lb 12.8 oz (72.9 kg)    Fetal Status: Fetal Heart Rate (bpm): 140 Fundal Height: 27 cm Movement: Present     General:  Alert, oriented and cooperative. Patient is in no acute distress.  Skin: Skin is warm and dry. No rash noted.   Cardiovascular: Normal heart rate noted  Respiratory: Normal respiratory effort, no problems with respiration noted  Abdomen: Soft, gravid, appropriate for gestational age.  Pain/Pressure: Present     Pelvic: Cervical exam deferred        Extremities: Normal range of motion.  Edema: Trace  Mental Status:  Normal mood and affect. Normal behavior. Normal judgment and thought content.   Assessment and Plan:  Pregnancy: G1P0 at 4272w6d  1. Supervision of normal first pregnancy, antepartum -Pt doing well no complaints  -Lab results reviewed with patient  -Anticipatory guidance on upcoming visits   Preterm labor symptoms and general obstetric precautions including but not limited to vaginal bleeding, contractions, leaking of fluid and fetal movement were reviewed in detail with the patient. Please refer to After Visit Summary for other counseling recommendations.  Return in about 2 weeks (around 06/07/2017) for ROB.   Sharyon CableVeronica C Parker Wherley,  CNM

## 2017-05-24 NOTE — Progress Notes (Incomplete)
   PRENATAL VISIT NOTE  Subjective:  Alexandra Hart is a 27 y.o. G1P0 at 1654w6d being seen today for ongoing prenatal care.  She is currently monitored for the following issues for this {Blank single:19197::"high-risk","low-risk"} pregnancy and has Supervision of normal first pregnancy, antepartum on their problem list.  Patient reports {sx:[id number]}.  Contractions: Not present. Vag. Bleeding: None.  Movement: Present. Denies leaking of fluid.   The following portions of the patient's history were reviewed and updated as appropriate: allergies, current medications, past family history, past medical history, past social history, past surgical history and problem list. Problem list updated.  Objective:   Vitals:   05/24/17 0924  BP: 120/78  Pulse: 91  Weight: 160 lb 12.8 oz (72.9 kg)    Fetal Status: Fetal Heart Rate (bpm): 140 Fundal Height: 27 cm Movement: Present     General:  Alert, oriented and cooperative. Patient is in no acute distress.  Skin: Skin is warm and dry. No rash noted.   Cardiovascular: Normal heart rate noted  Respiratory: Normal respiratory effort, no problems with respiration noted  Abdomen: Soft, gravid, appropriate for gestational age.  Pain/Pressure: Present     Pelvic: {Blank single:19197::"Cervical exam performed","Cervical exam deferred"}        Extremities: Normal range of motion.  Edema: Trace  Mental Status:  Normal mood and affect. Normal behavior. Normal judgment and thought content.   Assessment and Plan:  Pregnancy: G1P0 at 654w6d  1. Supervision of normal first pregnancy, antepartum ***  {Blank single:19197::"Term","Preterm"} labor symptoms and general obstetric precautions including but not limited to vaginal bleeding, contractions, leaking of fluid and fetal movement were reviewed in detail with the patient. Please refer to After Visit Summary for other counseling recommendations.  Return in about 2 weeks (around 06/07/2017) for ROB.   Sharyon CableVeronica C  Bekim Werntz, CNM

## 2017-05-24 NOTE — Patient Instructions (Signed)

## 2017-06-03 ENCOUNTER — Ambulatory Visit (INDEPENDENT_AMBULATORY_CARE_PROVIDER_SITE_OTHER): Payer: Federal, State, Local not specified - PPO | Admitting: Obstetrics and Gynecology

## 2017-06-03 ENCOUNTER — Encounter: Payer: Self-pay | Admitting: Obstetrics and Gynecology

## 2017-06-03 VITALS — BP 113/65 | HR 85 | Wt 160.0 lb

## 2017-06-03 DIAGNOSIS — Z3403 Encounter for supervision of normal first pregnancy, third trimester: Secondary | ICD-10-CM

## 2017-06-03 DIAGNOSIS — Z3400 Encounter for supervision of normal first pregnancy, unspecified trimester: Secondary | ICD-10-CM

## 2017-06-03 NOTE — Progress Notes (Signed)
   PRENATAL VISIT NOTE  Subjective:  Alexandra Hart is a 27 y.o. G1P0 at 238w2d being seen today for ongoing prenatal care.  She is currently monitored for the following issues for this low-risk pregnancy and has Supervision of normal first pregnancy, antepartum on their problem list.  Patient reports no complaints.  Contractions: Not present. Vag. Bleeding: None.  Movement: Present. Denies leaking of fluid.   The following portions of the patient's history were reviewed and updated as appropriate: allergies, current medications, past family history, past medical history, past social history, past surgical history and problem list. Problem list updated.  Objective:   Vitals:   06/03/17 1026  BP: 113/65  Pulse: 85  Weight: 160 lb (72.6 kg)    Fetal Status: Fetal Heart Rate (bpm): 140 Fundal Height: 30 cm Movement: Present     General:  Alert, oriented and cooperative. Patient is in no acute distress.  Skin: Skin is warm and dry. No rash noted.   Cardiovascular: Normal heart rate noted  Respiratory: Normal respiratory effort, no problems with respiration noted  Abdomen: Soft, gravid, appropriate for gestational age.  Pain/Pressure: Absent     Pelvic: Cervical exam deferred        Extremities: Normal range of motion.  Edema: None  Mental Status: Normal mood and affect. Normal behavior. Normal judgment and thought content.   Assessment and Plan:  Pregnancy: G1P0 at 378w2d  1. Supervision of normal first pregnancy, antepartum Patient is doing well without complaints Patient is interested in waterbirth- information provide Pain management options also discussed  Preterm labor symptoms and general obstetric precautions including but not limited to vaginal bleeding, contractions, leaking of fluid and fetal movement were reviewed in detail with the patient. Please refer to After Visit Summary for other counseling recommendations.  Return in about 2 weeks (around 06/17/2017) for  ROB.  Future Appointments  Date Time Provider Department Center  06/17/2017 10:30 AM Creg Gilmer, Gigi GinPeggy, MD [facility] None    Catalina AntiguaPeggy Nehemiah Montee, MD

## 2017-06-03 NOTE — Patient Instructions (Signed)
Considering Waterbirth? Guide for patients at [facility]  Why consider waterbirth?  . Gentle birth for babies . Less pain medicine used in labor . May allow for passive descent/less pushing . May reduce perineal tears  . More mobility and instinctive maternal position changes . Increased maternal relaxation . Reduced blood pressure in labor  Is waterbirth safe? What are the risks of infection, drowning or other complications?  . Infection: o Very low risk (3.7 % for tub vs 4.8% for bed) o 7 in 8000 waterbirths with documented infection o Poorly cleaned equipment most common cause o Slightly lower group B strep transmission rate  . Drowning o Maternal:  - Very low risk   - Related to seizures or fainting o Newborn:  - Very low risk. No evidence of increased risk of respiratory problems in multiple large studies - Physiological protection from breathing under water - Avoid underwater birth if there are any fetal complications - Once baby's head is out of the water, keep it out.  . Birth complication o Some reports of cord trauma, but risk decreased by bringing baby to surface gradually o No evidence of increased risk of shoulder dystocia. Mothers can usually change positions faster in water than in a bed, possibly aiding the maneuvers to free the shoulder.   You must attend a Doren Custard class at [facility]  3rd Wednesday of every month from 7-9pm  [employer] by calling [phone] or online at [email or web address]  Bring [place] the certificate from the class to your prenatal appointment  Meet with a midwife at 36 weeks to see if you can still plan a waterbirth and to sign the consent.   Purchase or rent the following supplies:   Water Birth Pool (Birth Pool in a Box or [place] for instance)  (Tubs start ~$125)  Single-use disposable tub liner designed for your brand of tub  New garden hose labeled "lead-free", "suitable for drinking  water",  Electric drain pump to remove water (We recommend 792 gallon per hour or greater pump.)   Separate garden hose to remove the dirty water  Fish net  Bathing suit top (optional)  Long-handled mirror (optional)  Places to purchase or rent supplies  [employer] for tub purchases and supplies  Waterbirthsolutions.com for tub purchases and supplies  [employer] (www.thelaborladies.com) $275 for tub rental/set-up & take down/kit   [employer] (http://www.fleming.com/.htm) Information regarding doulas (labor support) who provide pool rentals  Our practice has a Birth Pool in a Box tub at the hospital that you may borrow on a first-come-first-served basis. It is your responsibility to to set up, clean and break down the tub. We cannot guarantee the availability of this tub in advance. You are responsible for bringing all accessories listed above. If you do not have all necessary supplies you cannot have a waterbirth.    Things that would prevent you from having a waterbirth:  Premature, <37wks  Previous cesarean birth  Presence of thick meconium-stained fluid  Multiple gestation (Twins, triplets, etc.)  Uncontrolled diabetes or gestational diabetes requiring medication  Hypertension requiring medication or diagnosis of pre-eclampsia  Heavy vaginal bleeding  Non-reassuring fetal heart rate  Active infection (MRSA, etc.). Group B Strep is NOT a contraindication for  waterbirth.  If your labor has to be induced and induction method requires continuous  monitoring of the baby's heart rate  Other risks/issues identified by your obstetrical provider  Please remember that birth is unpredictable. Under certain unforeseeable  circumstances your provider may advise against giving birth in the tub. These decisions will be made on a case-by-case basis and with the safety of you and your baby as our highest priority.

## 2017-06-17 ENCOUNTER — Encounter: Payer: Self-pay | Admitting: Obstetrics and Gynecology

## 2017-06-17 ENCOUNTER — Ambulatory Visit (INDEPENDENT_AMBULATORY_CARE_PROVIDER_SITE_OTHER): Payer: Federal, State, Local not specified - PPO | Admitting: Obstetrics and Gynecology

## 2017-06-17 DIAGNOSIS — Z3403 Encounter for supervision of normal first pregnancy, third trimester: Secondary | ICD-10-CM

## 2017-06-17 DIAGNOSIS — Z3400 Encounter for supervision of normal first pregnancy, unspecified trimester: Secondary | ICD-10-CM

## 2017-06-17 MED ORDER — COMFORT FIT MATERNITY SUPP MED MISC: 0 refills | Status: DC

## 2017-06-17 NOTE — Progress Notes (Signed)
   PRENATAL VISIT NOTE  Subjective:  Alexandra Hart is a 27 y.o. G1P0 at 6970w2d being seen today for ongoing prenatal care.  She is currently monitored for the following issues for this low-risk pregnancy and has Supervision of normal first pregnancy, antepartum on their problem list.  Patient reports lower back and pelvic pain with ambulation.  Contractions: Not present. Vag. Bleeding: None.  Movement: Present. Denies leaking of fluid.   The following portions of the patient's history were reviewed and updated as appropriate: allergies, current medications, past family history, past medical history, past social history, past surgical history and problem list. Problem list updated.  Objective:   Vitals:   06/17/17 1039  BP: 103/68  Pulse: 77  Weight: 157 lb 11.2 oz (71.5 kg)    Fetal Status: Fetal Heart Rate (bpm): 145 Fundal Height: 32 cm Movement: Present     General:  Alert, oriented and cooperative. Patient is in no acute distress.  Skin: Skin is warm and dry. No rash noted.   Cardiovascular: Normal heart rate noted  Respiratory: Normal respiratory effort, no problems with respiration noted  Abdomen: Soft, gravid, appropriate for gestational age.  Pain/Pressure: Absent     Pelvic: Cervical exam deferred        Extremities: Normal range of motion.  Edema: Trace  Mental Status: Normal mood and affect. Normal behavior. Normal judgment and thought content.   Assessment and Plan:  Pregnancy: G1P0 at 9170w2d  1. Supervision of normal first pregnancy, antepartum Patient is doing well Rx maternity support belt provided Patient is still considering waterbirth  Preterm labor symptoms and general obstetric precautions including but not limited to vaginal bleeding, contractions, leaking of fluid and fetal movement were reviewed in detail with the patient. Please refer to After Visit Summary for other counseling recommendations.  Return in about 2 weeks (around 07/01/2017) for ROB.  No  future appointments.  Alexandra AntiguaPeggy Kathaleen Dudziak, MD

## 2017-06-18 ENCOUNTER — Encounter: Payer: Self-pay | Admitting: Certified Nurse Midwife

## 2017-06-18 DIAGNOSIS — O339 Maternal care for disproportion, unspecified: Secondary | ICD-10-CM | POA: Diagnosis not present

## 2017-07-01 ENCOUNTER — Ambulatory Visit (INDEPENDENT_AMBULATORY_CARE_PROVIDER_SITE_OTHER): Payer: Federal, State, Local not specified - PPO | Admitting: Obstetrics and Gynecology

## 2017-07-01 ENCOUNTER — Encounter: Payer: Self-pay | Admitting: Obstetrics and Gynecology

## 2017-07-01 ENCOUNTER — Other Ambulatory Visit: Payer: Self-pay

## 2017-07-01 VITALS — BP 107/66 | HR 80 | Wt 155.8 lb

## 2017-07-01 DIAGNOSIS — Z3403 Encounter for supervision of normal first pregnancy, third trimester: Secondary | ICD-10-CM

## 2017-07-01 DIAGNOSIS — R51 Headache: Secondary | ICD-10-CM

## 2017-07-01 DIAGNOSIS — O26892 Other specified pregnancy related conditions, second trimester: Secondary | ICD-10-CM

## 2017-07-01 DIAGNOSIS — O26893 Other specified pregnancy related conditions, third trimester: Secondary | ICD-10-CM

## 2017-07-01 DIAGNOSIS — Z3400 Encounter for supervision of normal first pregnancy, unspecified trimester: Secondary | ICD-10-CM

## 2017-07-01 MED ORDER — BUTALBITAL-APAP-CAFFEINE 50-325-40 MG PO CAPS: 1.0000 | ORAL_CAPSULE | Freq: Four times a day (QID) | ORAL | 3 refills | Status: DC | PRN

## 2017-07-01 NOTE — Progress Notes (Signed)
   PRENATAL VISIT NOTE  Subjective:  Alexandra Hart is a 27 y.o. G1P0 at [redacted]w[redacted]d being seen today for ongoing prenatal care.  She is currently monitored for the following issues for this low-risk pregnancy and has Supervision of normal first pregnancy, antepartum on their problem list.  Patient reports no complaints.  Contractions: Not present. Vag. Bleeding: None.  Movement: Present. Denies leaking of fluid.   The following portions of the patient's history were reviewed and updated as appropriate: allergies, current medications, past family history, past medical history, past social history, past surgical history and problem list. Problem list updated.  Objective:   Vitals:   07/01/17 1404  BP: 107/66  Pulse: 80  Weight: 155 lb 12.8 oz (70.7 kg)    Fetal Status: Fetal Heart Rate (bpm): 154 Fundal Height: 34 cm Movement: Present     General:  Alert, oriented and cooperative. Patient is in no acute distress.  Skin: Skin is warm and dry. No rash noted.   Cardiovascular: Normal heart rate noted  Respiratory: Normal respiratory effort, no problems with respiration noted  Abdomen: Soft, gravid, appropriate for gestational age.  Pain/Pressure: Absent     Pelvic: Cervical exam deferred        Extremities: Normal range of motion.  Edema: Trace  Mental Status: Normal mood and affect. Normal behavior. Normal judgment and thought content.   Assessment and Plan:  Pregnancy: G1P0 at [redacted]w[redacted]d  1. Supervision of normal first pregnancy, antepartum Patient is doing well without complaints Patient is interested in water birth - class scheduled on 5/15  2. Headache in pregnancy, antepartum, second trimester Patient asymptomatic and is requesting a refill on Fioricet - Butalbital-APAP-Caffeine 50-325-40 MG capsule; Take 1-2 capsules by mouth every 6 (six) hours as needed for headache.  Dispense: 30 capsule; Refill: 3  Preterm labor symptoms and general obstetric precautions including but not limited  to vaginal bleeding, contractions, leaking of fluid and fetal movement were reviewed in detail with the patient. Please refer to After Visit Summary for other counseling recommendations.  Return in about 2 weeks (around 07/15/2017) for ROB.  No future appointments.  Catalina Antigua, MD

## 2017-07-01 NOTE — Progress Notes (Signed)
ROB

## 2017-07-15 ENCOUNTER — Encounter: Payer: Self-pay | Admitting: Obstetrics and Gynecology

## 2017-07-15 ENCOUNTER — Ambulatory Visit (INDEPENDENT_AMBULATORY_CARE_PROVIDER_SITE_OTHER): Payer: Federal, State, Local not specified - PPO | Admitting: Obstetrics and Gynecology

## 2017-07-15 DIAGNOSIS — Z113 Encounter for screening for infections with a predominantly sexual mode of transmission: Secondary | ICD-10-CM

## 2017-07-15 DIAGNOSIS — Z3400 Encounter for supervision of normal first pregnancy, unspecified trimester: Secondary | ICD-10-CM

## 2017-07-15 LAB — OB RESULTS CONSOLE GC/CHLAMYDIA: Gonorrhea: NEGATIVE

## 2017-07-15 NOTE — Progress Notes (Signed)
   PRENATAL VISIT NOTE  Subjective:  Alexandra Hart is a 27 y.o. G1P0 at [redacted]w[redacted]d being seen today for ongoing prenatal care.  She is currently monitored for the following issues for this low-risk pregnancy and has Supervision of normal first pregnancy, antepartum on their problem list.  Patient reports no complaints.  Contractions: Not present. Vag. Bleeding: None.  Movement: Present. Denies leaking of fluid.   The following portions of the patient's history were reviewed and updated as appropriate: allergies, current medications, past family history, past medical history, past social history, past surgical history and problem list. Problem list updated.  Objective:   Vitals:   07/15/17 1417  BP: 110/72  Pulse: 82  Weight: 154 lb 6.4 oz (70 kg)    Fetal Status: Fetal Heart Rate (bpm): 136 Fundal Height: 35 cm Movement: Present  Presentation: Vertex  General:  Alert, oriented and cooperative. Patient is in no acute distress.  Skin: Skin is warm and dry. No rash noted.   Cardiovascular: Normal heart rate noted  Respiratory: Normal respiratory effort, no problems with respiration noted  Abdomen: Soft, gravid, appropriate for gestational age.  Pain/Pressure: Absent     Pelvic: Cervical exam performed Dilation: 2 Effacement (%): 50 Station: -3  Extremities: Normal range of motion.  Edema: Trace  Mental Status: Normal mood and affect. Normal behavior. Normal judgment and thought content.   Assessment and Plan:  Pregnancy: G1P0 at [redacted]w[redacted]d  1. Supervision of normal first pregnancy, antepartum Patient is doing well without complaints Cultures collected Patient scheduled for waterbirth class this week - Strep Gp B NAA - GC/Chlamydia probe amp ([place])not at [facility]  Preterm labor symptoms and general obstetric precautions including but not limited to vaginal bleeding, contractions, leaking of fluid and fetal movement were reviewed in detail with the patient. Please refer to After Visit  Summary for other counseling recommendations.  No follow-ups on file.  No future appointments.  Catalina Antigua, MD

## 2017-07-16 LAB — GC/CHLAMYDIA PROBE AMP (~~LOC~~) NOT AT ARMC
CHLAMYDIA, DNA PROBE: NEGATIVE
Neisseria Gonorrhea: NEGATIVE

## 2017-07-17 LAB — STREP GP B NAA: STREP GROUP B AG: NEGATIVE

## 2017-07-23 ENCOUNTER — Encounter: Payer: Self-pay | Admitting: Obstetrics and Gynecology

## 2017-07-23 ENCOUNTER — Encounter: Payer: Self-pay | Admitting: *Deleted

## 2017-07-23 ENCOUNTER — Ambulatory Visit (INDEPENDENT_AMBULATORY_CARE_PROVIDER_SITE_OTHER): Payer: Federal, State, Local not specified - PPO | Admitting: Obstetrics and Gynecology

## 2017-07-23 VITALS — BP 103/68 | HR 85 | Wt 155.1 lb

## 2017-07-23 DIAGNOSIS — Z3403 Encounter for supervision of normal first pregnancy, third trimester: Secondary | ICD-10-CM

## 2017-07-23 DIAGNOSIS — Z3400 Encounter for supervision of normal first pregnancy, unspecified trimester: Secondary | ICD-10-CM

## 2017-07-23 NOTE — Patient Instructions (Signed)
Vaginal Delivery Vaginal delivery means that you will give birth by pushing your baby out of your birth canal (vagina). A team of health care providers will help you before, during, and after vaginal delivery. Birth experiences are unique for every woman and every pregnancy, and birth experiences vary depending on where you choose to give birth. What should I do to prepare for my baby's birth? Before your baby is born, it is important to talk with your health care provider about:  Your labor and delivery preferences. These may include: ? Medicines that you may be given. ? How you will manage your pain. This might include non-medical pain relief techniques or injectable pain relief such as epidural analgesia. ? How you and your baby will be monitored during labor and delivery. ? Who may be in the labor and delivery room with you. ? Your feelings about surgical delivery of your baby (cesarean delivery, or C-section) if this becomes necessary. ? Your feelings about receiving donated blood through an IV tube (blood transfusion) if this becomes necessary.  Whether you are able: ? To take pictures or videos of the birth. ? To eat during labor and delivery. ? To move around, walk, or change positions during labor and delivery.  What to expect after your baby is born, such as: ? Whether delayed umbilical cord clamping and cutting is offered. ? Who will care for your baby right after birth. ? Medicines or tests that may be recommended for your baby. ? Whether breastfeeding is supported in your hospital or birth center. ? How long you will be in the hospital or birth center.  How any medical conditions you have may affect your baby or your labor and delivery experience.  To prepare for your baby's birth, you should also:  Attend all of your health care visits before delivery (prenatal visits) as recommended by your health care provider. This is important.  Prepare your home for your baby's  arrival. Make sure that you have: ? Diapers. ? Baby clothing. ? Feeding equipment. ? Safe sleeping arrangements for you and your baby.  Install a car seat in your vehicle. Have your car seat checked by a certified car seat installer to make sure that it is installed safely.  Think about who will help you with your new baby at home for at least the first several weeks after delivery.  What can I expect when I arrive at the birth center or hospital? Once you are in labor and have been admitted into the hospital or birth center, your health care provider may:  Review your pregnancy history and any concerns you have.  Insert an IV tube into one of your veins. This is used to give you fluids and medicines.  Check your blood pressure, pulse, temperature, and heart rate (vital signs).  Check whether your bag of water (amniotic sac) has broken (ruptured).  Talk with you about your birth plan and discuss pain control options.  Monitoring Your health care provider may monitor your contractions (uterine monitoring) and your baby's heart rate (fetal monitoring). You may need to be monitored:  Often, but not continuously (intermittently).  All the time or for long periods at a time (continuously). Continuous monitoring may be needed if: ? You are taking certain medicines, such as medicine to relieve pain or make your contractions stronger. ? You have pregnancy or labor complications.  Monitoring may be done by:  Placing a special stethoscope or a handheld monitoring device on your abdomen to   check your baby's heartbeat, and feeling your abdomen for contractions. This method of monitoring does not continuously record your baby's heartbeat or your contractions.  Placing monitors on your abdomen (external monitors) to record your baby's heartbeat and the frequency and length of contractions. You may not have to wear external monitors all the time.  Placing monitors inside of your uterus  (internal monitors) to record your baby's heartbeat and the frequency, length, and strength of your contractions. ? Your health care provider may use internal monitors if he or she needs more information about the strength of your contractions or your baby's heart rate. ? Internal monitors are put in place by passing a thin, flexible wire through your vagina and into your uterus. Depending on the type of monitor, it may remain in your uterus or on your baby's head until birth. ? Your health care provider will discuss the benefits and risks of internal monitoring with you and will ask for your permission before inserting the monitors.  Telemetry. This is a type of continuous monitoring that can be done with external or internal monitors. Instead of having to stay in bed, you are able to move around during telemetry. Ask your health care provider if telemetry is an option for you.  Physical exam Your health care provider may perform a physical exam. This may include:  Checking whether your baby is positioned: ? With the head toward your vagina (head-down). This is most common. ? With the head toward the top of your uterus (head-up or breech). If your baby is in a breech position, your health care provider may try to turn your baby to a head-down position so you can deliver vaginally. If it does not seem that your baby can be born vaginally, your provider may recommend surgery to deliver your baby. In rare cases, you may be able to deliver vaginally if your baby is head-up (breech delivery). ? Lying sideways (transverse). Babies that are lying sideways cannot be delivered vaginally.  Checking your cervix to determine: ? Whether it is thinning out (effacing). ? Whether it is opening up (dilating). ? How low your baby has moved into your birth canal.  What are the three stages of labor and delivery?  Normal labor and delivery is divided into the following three stages: Stage 1  Stage 1 is the  longest stage of labor, and it can last for hours or days. Stage 1 includes: ? Early labor. This is when contractions may be irregular, or regular and mild. Generally, early labor contractions are more than 10 minutes apart. ? Active labor. This is when contractions get longer, more regular, more frequent, and more intense. ? The transition phase. This is when contractions happen very close together, are very intense, and may last longer than during any other part of labor.  Contractions generally feel mild, infrequent, and irregular at first. They get stronger, more frequent (about every 2-3 minutes), and more regular as you progress from early labor through active labor and transition.  Many women progress through stage 1 naturally, but you may need help to continue making progress. If this happens, your health care provider may talk with you about: ? Rupturing your amniotic sac if it has not ruptured yet. ? Giving you medicine to help make your contractions stronger and more frequent.  Stage 1 ends when your cervix is completely dilated to 4 inches (10 cm) and completely effaced. This happens at the end of the transition phase. Stage 2  Once   your cervix is completely effaced and dilated to 4 inches (10 cm), you may start to feel an urge to push. It is common for the body to naturally take a rest before feeling the urge to push, especially if you received an epidural or certain other pain medicines. This rest period may last for up to 1-2 hours, depending on your unique labor experience.  During stage 2, contractions are generally less painful, because pushing helps relieve contraction pain. Instead of contraction pain, you may feel stretching and burning pain, especially when the widest part of your baby's head passes through the vaginal opening (crowning).  Your health care provider will closely monitor your pushing progress and your baby's progress through the vagina during stage 2.  Your  health care provider may massage the area of skin between your vaginal opening and anus (perineum) or apply warm compresses to your perineum. This helps it stretch as the baby's head starts to crown, which can help prevent perineal tearing. ? In some cases, an incision may be made in your perineum (episiotomy) to allow the baby to pass through the vaginal opening. An episiotomy helps to make the opening of the vagina larger to allow more room for the baby to fit through.  It is very important to breathe and focus so your health care provider can control the delivery of your baby's head. Your health care provider may have you decrease the intensity of your pushing, to help prevent perineal tearing.  After delivery of your baby's head, the shoulders and the rest of the body generally deliver very quickly and without difficulty.  Once your baby is delivered, the umbilical cord may be cut right away, or this may be delayed for 1-2 minutes, depending on your baby's health. This may vary among health care providers, hospitals, and birth centers.  If you and your baby are healthy enough, your baby may be placed on your chest or abdomen to help maintain the baby's temperature and to help you bond with each other. Some mothers and babies start breastfeeding at this time. Your health care team will dry your baby and help keep your baby warm during this time.  Your baby may need immediate care if he or she: ? Showed signs of distress during labor. ? Has a medical condition. ? Was born too early (prematurely). ? Had a bowel movement before birth (meconium). ? Shows signs of difficulty transitioning from being inside the uterus to being outside of the uterus. If you are planning to breastfeed, your health care team will help you begin a feeding. Stage 3  The third stage of labor starts immediately after the birth of your baby and ends after you deliver the placenta. The placenta is an organ that develops  during pregnancy to provide oxygen and nutrients to your baby in the womb.  Delivering the placenta may require some pushing, and you may have mild contractions. Breastfeeding can stimulate contractions to help you deliver the placenta.  After the placenta is delivered, your uterus should tighten (contract) and become firm. This helps to stop bleeding in your uterus. To help your uterus contract and to control bleeding, your health care provider may: ? Give you medicine by injection, through an IV tube, by mouth, or through your rectum (rectally). ? Massage your abdomen or perform a vaginal exam to remove any blood clots that are left in your uterus. ? Empty your bladder by placing a thin, flexible tube (catheter) into your bladder. ? Encourage   you to breastfeed your baby. After labor is over, you and your baby will be monitored closely to ensure that you are both healthy until you are ready to go home. Your health care team will teach you how to care for yourself and your baby. This information is not intended to replace advice given to you by your health care provider. Make sure you discuss any questions you have with your health care provider. Document Released: 11/29/2007 Document Revised: 09/09/2015 Document Reviewed: 03/06/2015 Elsevier Interactive Patient Education  2018 [employer]  

## 2017-07-23 NOTE — Progress Notes (Signed)
Subjective:  Alexandra Hart is a 27 y.o. G1P0 at [redacted]w[redacted]d being seen today for ongoing prenatal care.  She is currently monitored for the following issues for this low-risk pregnancy and has Supervision of normal first pregnancy, antepartum on their problem list.  Patient reports no complaints.  Contractions: Not present. Vag. Bleeding: None.  Movement: Present. Denies leaking of fluid.   The following portions of the patient's history were reviewed and updated as appropriate: allergies, current medications, past family history, past medical history, past social history, past surgical history and problem list. Problem list updated.  Objective:   Vitals:   07/23/17 1505  BP: 103/68  Pulse: 85  Weight: 155 lb 1.6 oz (70.4 kg)    Fetal Status: Fetal Heart Rate (bpm): 140   Movement: Present     General:  Alert, oriented and cooperative. Patient is in no acute distress.  Skin: Skin is warm and dry. No rash noted.   Cardiovascular: Normal heart rate noted  Respiratory: Normal respiratory effort, no problems with respiration noted  Abdomen: Soft, gravid, appropriate for gestational age. Pain/Pressure: Present     Pelvic:  Cervical exam deferred        Extremities: Normal range of motion.  Edema: Trace  Mental Status: Normal mood and affect. Normal behavior. Normal judgment and thought content.   Urinalysis:      Assessment and Plan:  Pregnancy: G1P0 at [redacted]w[redacted]d  1. Supervision of normal first pregnancy, antepartum Labor precautions  Term labor symptoms and general obstetric precautions including but not limited to vaginal bleeding, contractions, leaking of fluid and fetal movement were reviewed in detail with the patient. Please refer to After Visit Summary for other counseling recommendations.  Return in about 1 week (around 07/30/2017) for OB visit.   Hermina Staggers, MD

## 2017-08-01 ENCOUNTER — Ambulatory Visit (INDEPENDENT_AMBULATORY_CARE_PROVIDER_SITE_OTHER): Payer: Federal, State, Local not specified - PPO | Admitting: Obstetrics and Gynecology

## 2017-08-01 ENCOUNTER — Encounter: Payer: Self-pay | Admitting: Obstetrics and Gynecology

## 2017-08-01 VITALS — BP 116/78 | HR 81 | Wt 157.4 lb

## 2017-08-01 DIAGNOSIS — Z3400 Encounter for supervision of normal first pregnancy, unspecified trimester: Secondary | ICD-10-CM

## 2017-08-01 NOTE — Progress Notes (Signed)
Subjective:  Alexandra Hart is a 27 y.o. G1P0 at [redacted]w[redacted]d being seen today for ongoing prenatal care.  She is currently monitored for the following issues for this low-risk pregnancy and has Supervision of normal first pregnancy, antepartum on their problem list.  Patient reports no complaints.  Contractions: Not present. Vag. Bleeding: None.  Movement: Present. Denies leaking of fluid.   The following portions of the patient's history were reviewed and updated as appropriate: allergies, current medications, past family history, past medical history, past social history, past surgical history and problem list. Problem list updated.  Objective:   Vitals:   08/01/17 1613  BP: 116/78  Pulse: 81  Weight: 157 lb 6.4 oz (71.4 kg)    Fetal Status:     Movement: Present     General:  Alert, oriented and cooperative. Patient is in no acute distress.  Skin: Skin is warm and dry. No rash noted.   Cardiovascular: Normal heart rate noted  Respiratory: Normal respiratory effort, no problems with respiration noted  Abdomen: Soft, gravid, appropriate for gestational age. Pain/Pressure: Present     Pelvic:  Cervical exam deferred        Extremities: Normal range of motion.  Edema: Trace  Mental Status: Normal mood and affect. Normal behavior. Normal judgment and thought content.   Urinalysis:      Assessment and Plan:  Pregnancy: G1P0 at [redacted]w[redacted]d  1. Supervision of normal first pregnancy, antepartum Labor precautions  Term labor symptoms and general obstetric precautions including but not limited to vaginal bleeding, contractions, leaking of fluid and fetal movement were reviewed in detail with the patient. Please refer to After Visit Summary for other counseling recommendations.  Return in about 1 week (around 08/08/2017) for OB visit.   Hermina Staggers, MD

## 2017-08-01 NOTE — Patient Instructions (Signed)
Vaginal Delivery Vaginal delivery means that you will give birth by pushing your baby out of your birth canal (vagina). A team of health care providers will help you before, during, and after vaginal delivery. Birth experiences are unique for every woman and every pregnancy, and birth experiences vary depending on where you choose to give birth. What should I do to prepare for my baby's birth? Before your baby is born, it is important to talk with your health care provider about:  Your labor and delivery preferences. These may include: ? Medicines that you may be given. ? How you will manage your pain. This might include non-medical pain relief techniques or injectable pain relief such as epidural analgesia. ? How you and your baby will be monitored during labor and delivery. ? Who may be in the labor and delivery room with you. ? Your feelings about surgical delivery of your baby (cesarean delivery, or C-section) if this becomes necessary. ? Your feelings about receiving donated blood through an IV tube (blood transfusion) if this becomes necessary.  Whether you are able: ? To take pictures or videos of the birth. ? To eat during labor and delivery. ? To move around, walk, or change positions during labor and delivery.  What to expect after your baby is born, such as: ? Whether delayed umbilical cord clamping and cutting is offered. ? Who will care for your baby right after birth. ? Medicines or tests that may be recommended for your baby. ? Whether breastfeeding is supported in your hospital or birth center. ? How long you will be in the hospital or birth center.  How any medical conditions you have may affect your baby or your labor and delivery experience.  To prepare for your baby's birth, you should also:  Attend all of your health care visits before delivery (prenatal visits) as recommended by your health care provider. This is important.  Prepare your home for your baby's  arrival. Make sure that you have: ? Diapers. ? Baby clothing. ? Feeding equipment. ? Safe sleeping arrangements for you and your baby.  Install a car seat in your vehicle. Have your car seat checked by a certified car seat installer to make sure that it is installed safely.  Think about who will help you with your new baby at home for at least the first several weeks after delivery.  What can I expect when I arrive at the birth center or hospital? Once you are in labor and have been admitted into the hospital or birth center, your health care provider may:  Review your pregnancy history and any concerns you have.  Insert an IV tube into one of your veins. This is used to give you fluids and medicines.  Check your blood pressure, pulse, temperature, and heart rate (vital signs).  Check whether your bag of water (amniotic sac) has broken (ruptured).  Talk with you about your birth plan and discuss pain control options.  Monitoring Your health care provider may monitor your contractions (uterine monitoring) and your baby's heart rate (fetal monitoring). You may need to be monitored:  Often, but not continuously (intermittently).  All the time or for long periods at a time (continuously). Continuous monitoring may be needed if: ? You are taking certain medicines, such as medicine to relieve pain or make your contractions stronger. ? You have pregnancy or labor complications.  Monitoring may be done by:  Placing a special stethoscope or a handheld monitoring device on your abdomen to   check your baby's heartbeat, and feeling your abdomen for contractions. This method of monitoring does not continuously record your baby's heartbeat or your contractions.  Placing monitors on your abdomen (external monitors) to record your baby's heartbeat and the frequency and length of contractions. You may not have to wear external monitors all the time.  Placing monitors inside of your uterus  (internal monitors) to record your baby's heartbeat and the frequency, length, and strength of your contractions. ? Your health care provider may use internal monitors if he or she needs more information about the strength of your contractions or your baby's heart rate. ? Internal monitors are put in place by passing a thin, flexible wire through your vagina and into your uterus. Depending on the type of monitor, it may remain in your uterus or on your baby's head until birth. ? Your health care provider will discuss the benefits and risks of internal monitoring with you and will ask for your permission before inserting the monitors.  Telemetry. This is a type of continuous monitoring that can be done with external or internal monitors. Instead of having to stay in bed, you are able to move around during telemetry. Ask your health care provider if telemetry is an option for you.  Physical exam Your health care provider may perform a physical exam. This may include:  Checking whether your baby is positioned: ? With the head toward your vagina (head-down). This is most common. ? With the head toward the top of your uterus (head-up or breech). If your baby is in a breech position, your health care provider may try to turn your baby to a head-down position so you can deliver vaginally. If it does not seem that your baby can be born vaginally, your provider may recommend surgery to deliver your baby. In rare cases, you may be able to deliver vaginally if your baby is head-up (breech delivery). ? Lying sideways (transverse). Babies that are lying sideways cannot be delivered vaginally.  Checking your cervix to determine: ? Whether it is thinning out (effacing). ? Whether it is opening up (dilating). ? How low your baby has moved into your birth canal.  What are the three stages of labor and delivery?  Normal labor and delivery is divided into the following three stages: Stage 1  Stage 1 is the  longest stage of labor, and it can last for hours or days. Stage 1 includes: ? Early labor. This is when contractions may be irregular, or regular and mild. Generally, early labor contractions are more than 10 minutes apart. ? Active labor. This is when contractions get longer, more regular, more frequent, and more intense. ? The transition phase. This is when contractions happen very close together, are very intense, and may last longer than during any other part of labor.  Contractions generally feel mild, infrequent, and irregular at first. They get stronger, more frequent (about every 2-3 minutes), and more regular as you progress from early labor through active labor and transition.  Many women progress through stage 1 naturally, but you may need help to continue making progress. If this happens, your health care provider may talk with you about: ? Rupturing your amniotic sac if it has not ruptured yet. ? Giving you medicine to help make your contractions stronger and more frequent.  Stage 1 ends when your cervix is completely dilated to 4 inches (10 cm) and completely effaced. This happens at the end of the transition phase. Stage 2  Once   your cervix is completely effaced and dilated to 4 inches (10 cm), you may start to feel an urge to push. It is common for the body to naturally take a rest before feeling the urge to push, especially if you received an epidural or certain other pain medicines. This rest period may last for up to 1-2 hours, depending on your unique labor experience.  During stage 2, contractions are generally less painful, because pushing helps relieve contraction pain. Instead of contraction pain, you may feel stretching and burning pain, especially when the widest part of your baby's head passes through the vaginal opening (crowning).  Your health care provider will closely monitor your pushing progress and your baby's progress through the vagina during stage 2.  Your  health care provider may massage the area of skin between your vaginal opening and anus (perineum) or apply warm compresses to your perineum. This helps it stretch as the baby's head starts to crown, which can help prevent perineal tearing. ? In some cases, an incision may be made in your perineum (episiotomy) to allow the baby to pass through the vaginal opening. An episiotomy helps to make the opening of the vagina larger to allow more room for the baby to fit through.  It is very important to breathe and focus so your health care provider can control the delivery of your baby's head. Your health care provider may have you decrease the intensity of your pushing, to help prevent perineal tearing.  After delivery of your baby's head, the shoulders and the rest of the body generally deliver very quickly and without difficulty.  Once your baby is delivered, the umbilical cord may be cut right away, or this may be delayed for 1-2 minutes, depending on your baby's health. This may vary among health care providers, hospitals, and birth centers.  If you and your baby are healthy enough, your baby may be placed on your chest or abdomen to help maintain the baby's temperature and to help you bond with each other. Some mothers and babies start breastfeeding at this time. Your health care team will dry your baby and help keep your baby warm during this time.  Your baby may need immediate care if he or she: ? Showed signs of distress during labor. ? Has a medical condition. ? Was born too early (prematurely). ? Had a bowel movement before birth (meconium). ? Shows signs of difficulty transitioning from being inside the uterus to being outside of the uterus. If you are planning to breastfeed, your health care team will help you begin a feeding. Stage 3  The third stage of labor starts immediately after the birth of your baby and ends after you deliver the placenta. The placenta is an organ that develops  during pregnancy to provide oxygen and nutrients to your baby in the womb.  Delivering the placenta may require some pushing, and you may have mild contractions. Breastfeeding can stimulate contractions to help you deliver the placenta.  After the placenta is delivered, your uterus should tighten (contract) and become firm. This helps to stop bleeding in your uterus. To help your uterus contract and to control bleeding, your health care provider may: ? Give you medicine by injection, through an IV tube, by mouth, or through your rectum (rectally). ? Massage your abdomen or perform a vaginal exam to remove any blood clots that are left in your uterus. ? Empty your bladder by placing a thin, flexible tube (catheter) into your bladder. ? Encourage   you to breastfeed your baby. After labor is over, you and your baby will be monitored closely to ensure that you are both healthy until you are ready to go home. Your health care team will teach you how to care for yourself and your baby. This information is not intended to replace advice given to you by your health care provider. Make sure you discuss any questions you have with your health care provider. Document Released: 11/29/2007 Document Revised: 09/09/2015 Document Reviewed: 03/06/2015 Elsevier Interactive Patient Education  2018 [employer]  

## 2017-08-07 ENCOUNTER — Ambulatory Visit (INDEPENDENT_AMBULATORY_CARE_PROVIDER_SITE_OTHER): Payer: Federal, State, Local not specified - PPO | Admitting: Obstetrics and Gynecology

## 2017-08-07 ENCOUNTER — Encounter: Payer: Self-pay | Admitting: Obstetrics and Gynecology

## 2017-08-07 VITALS — BP 112/74 | HR 98 | Wt 160.6 lb

## 2017-08-07 DIAGNOSIS — Z3400 Encounter for supervision of normal first pregnancy, unspecified trimester: Secondary | ICD-10-CM

## 2017-08-07 NOTE — Patient Instructions (Signed)
Vaginal Delivery Vaginal delivery means that you will give birth by pushing your baby out of your birth canal (vagina). A team of health care providers will help you before, during, and after vaginal delivery. Birth experiences are unique for every woman and every pregnancy, and birth experiences vary depending on where you choose to give birth. What should I do to prepare for my baby's birth? Before your baby is born, it is important to talk with your health care provider about:  Your labor and delivery preferences. These may include: ? Medicines that you may be given. ? How you will manage your pain. This might include non-medical pain relief techniques or injectable pain relief such as epidural analgesia. ? How you and your baby will be monitored during labor and delivery. ? Who may be in the labor and delivery room with you. ? Your feelings about surgical delivery of your baby (cesarean delivery, or C-section) if this becomes necessary. ? Your feelings about receiving donated blood through an IV tube (blood transfusion) if this becomes necessary.  Whether you are able: ? To take pictures or videos of the birth. ? To eat during labor and delivery. ? To move around, walk, or change positions during labor and delivery.  What to expect after your baby is born, such as: ? Whether delayed umbilical cord clamping and cutting is offered. ? Who will care for your baby right after birth. ? Medicines or tests that may be recommended for your baby. ? Whether breastfeeding is supported in your hospital or birth center. ? How long you will be in the hospital or birth center.  How any medical conditions you have may affect your baby or your labor and delivery experience.  To prepare for your baby's birth, you should also:  Attend all of your health care visits before delivery (prenatal visits) as recommended by your health care provider. This is important.  Prepare your home for your baby's  arrival. Make sure that you have: ? Diapers. ? Baby clothing. ? Feeding equipment. ? Safe sleeping arrangements for you and your baby.  Install a car seat in your vehicle. Have your car seat checked by a certified car seat installer to make sure that it is installed safely.  Think about who will help you with your new baby at home for at least the first several weeks after delivery.  What can I expect when I arrive at the birth center or hospital? Once you are in labor and have been admitted into the hospital or birth center, your health care provider may:  Review your pregnancy history and any concerns you have.  Insert an IV tube into one of your veins. This is used to give you fluids and medicines.  Check your blood pressure, pulse, temperature, and heart rate (vital signs).  Check whether your bag of water (amniotic sac) has broken (ruptured).  Talk with you about your birth plan and discuss pain control options.  Monitoring Your health care provider may monitor your contractions (uterine monitoring) and your baby's heart rate (fetal monitoring). You may need to be monitored:  Often, but not continuously (intermittently).  All the time or for long periods at a time (continuously). Continuous monitoring may be needed if: ? You are taking certain medicines, such as medicine to relieve pain or make your contractions stronger. ? You have pregnancy or labor complications.  Monitoring may be done by:  Placing a special stethoscope or a handheld monitoring device on your abdomen to   check your baby's heartbeat, and feeling your abdomen for contractions. This method of monitoring does not continuously record your baby's heartbeat or your contractions.  Placing monitors on your abdomen (external monitors) to record your baby's heartbeat and the frequency and length of contractions. You may not have to wear external monitors all the time.  Placing monitors inside of your uterus  (internal monitors) to record your baby's heartbeat and the frequency, length, and strength of your contractions. ? Your health care provider may use internal monitors if he or she needs more information about the strength of your contractions or your baby's heart rate. ? Internal monitors are put in place by passing a thin, flexible wire through your vagina and into your uterus. Depending on the type of monitor, it may remain in your uterus or on your baby's head until birth. ? Your health care provider will discuss the benefits and risks of internal monitoring with you and will ask for your permission before inserting the monitors.  Telemetry. This is a type of continuous monitoring that can be done with external or internal monitors. Instead of having to stay in bed, you are able to move around during telemetry. Ask your health care provider if telemetry is an option for you.  Physical exam Your health care provider may perform a physical exam. This may include:  Checking whether your baby is positioned: ? With the head toward your vagina (head-down). This is most common. ? With the head toward the top of your uterus (head-up or breech). If your baby is in a breech position, your health care provider may try to turn your baby to a head-down position so you can deliver vaginally. If it does not seem that your baby can be born vaginally, your provider may recommend surgery to deliver your baby. In rare cases, you may be able to deliver vaginally if your baby is head-up (breech delivery). ? Lying sideways (transverse). Babies that are lying sideways cannot be delivered vaginally.  Checking your cervix to determine: ? Whether it is thinning out (effacing). ? Whether it is opening up (dilating). ? How low your baby has moved into your birth canal.  What are the three stages of labor and delivery?  Normal labor and delivery is divided into the following three stages: Stage 1  Stage 1 is the  longest stage of labor, and it can last for hours or days. Stage 1 includes: ? Early labor. This is when contractions may be irregular, or regular and mild. Generally, early labor contractions are more than 10 minutes apart. ? Active labor. This is when contractions get longer, more regular, more frequent, and more intense. ? The transition phase. This is when contractions happen very close together, are very intense, and may last longer than during any other part of labor.  Contractions generally feel mild, infrequent, and irregular at first. They get stronger, more frequent (about every 2-3 minutes), and more regular as you progress from early labor through active labor and transition.  Many women progress through stage 1 naturally, but you may need help to continue making progress. If this happens, your health care provider may talk with you about: ? Rupturing your amniotic sac if it has not ruptured yet. ? Giving you medicine to help make your contractions stronger and more frequent.  Stage 1 ends when your cervix is completely dilated to 4 inches (10 cm) and completely effaced. This happens at the end of the transition phase. Stage 2  Once   your cervix is completely effaced and dilated to 4 inches (10 cm), you may start to feel an urge to push. It is common for the body to naturally take a rest before feeling the urge to push, especially if you received an epidural or certain other pain medicines. This rest period may last for up to 1-2 hours, depending on your unique labor experience.  During stage 2, contractions are generally less painful, because pushing helps relieve contraction pain. Instead of contraction pain, you may feel stretching and burning pain, especially when the widest part of your baby's head passes through the vaginal opening (crowning).  Your health care provider will closely monitor your pushing progress and your baby's progress through the vagina during stage 2.  Your  health care provider may massage the area of skin between your vaginal opening and anus (perineum) or apply warm compresses to your perineum. This helps it stretch as the baby's head starts to crown, which can help prevent perineal tearing. ? In some cases, an incision may be made in your perineum (episiotomy) to allow the baby to pass through the vaginal opening. An episiotomy helps to make the opening of the vagina larger to allow more room for the baby to fit through.  It is very important to breathe and focus so your health care provider can control the delivery of your baby's head. Your health care provider may have you decrease the intensity of your pushing, to help prevent perineal tearing.  After delivery of your baby's head, the shoulders and the rest of the body generally deliver very quickly and without difficulty.  Once your baby is delivered, the umbilical cord may be cut right away, or this may be delayed for 1-2 minutes, depending on your baby's health. This may vary among health care providers, hospitals, and birth centers.  If you and your baby are healthy enough, your baby may be placed on your chest or abdomen to help maintain the baby's temperature and to help you bond with each other. Some mothers and babies start breastfeeding at this time. Your health care team will dry your baby and help keep your baby warm during this time.  Your baby may need immediate care if he or she: ? Showed signs of distress during labor. ? Has a medical condition. ? Was born too early (prematurely). ? Had a bowel movement before birth (meconium). ? Shows signs of difficulty transitioning from being inside the uterus to being outside of the uterus. If you are planning to breastfeed, your health care team will help you begin a feeding. Stage 3  The third stage of labor starts immediately after the birth of your baby and ends after you deliver the placenta. The placenta is an organ that develops  during pregnancy to provide oxygen and nutrients to your baby in the womb.  Delivering the placenta may require some pushing, and you may have mild contractions. Breastfeeding can stimulate contractions to help you deliver the placenta.  After the placenta is delivered, your uterus should tighten (contract) and become firm. This helps to stop bleeding in your uterus. To help your uterus contract and to control bleeding, your health care provider may: ? Give you medicine by injection, through an IV tube, by mouth, or through your rectum (rectally). ? Massage your abdomen or perform a vaginal exam to remove any blood clots that are left in your uterus. ? Empty your bladder by placing a thin, flexible tube (catheter) into your bladder. ? Encourage   you to breastfeed your baby. After labor is over, you and your baby will be monitored closely to ensure that you are both healthy until you are ready to go home. Your health care team will teach you how to care for yourself and your baby. This information is not intended to replace advice given to you by your health care provider. Make sure you discuss any questions you have with your health care provider. Document Released: 11/29/2007 Document Revised: 09/09/2015 Document Reviewed: 03/06/2015 Elsevier Interactive Patient Education  2018 [employer]  

## 2017-08-07 NOTE — Progress Notes (Signed)
Subjective:  Alexandra Hart is a 27 y.o. G1P0 at 2965w4d being seen today for ongoing prenatal care.  She is currently monitored for the following issues for this low-risk pregnancy and has Supervision of normal first pregnancy, antepartum on their problem list.  Patient reports no complaints.  Contractions: Irritability. Vag. Bleeding: None.  Movement: Present. Denies leaking of fluid.   The following portions of the patient's history were reviewed and updated as appropriate: allergies, current medications, past family history, past medical history, past social history, past surgical history and problem list. Problem list updated.  Objective:   Vitals:   08/07/17 1559  BP: 112/74  Pulse: 98  Weight: 160 lb 9.6 oz (72.8 kg)    Fetal Status: Fetal Heart Rate (bpm): 140   Movement: Present     General:  Alert, oriented and cooperative. Patient is in no acute distress.  Skin: Skin is warm and dry. No rash noted.   Cardiovascular: Normal heart rate noted  Respiratory: Normal respiratory effort, no problems with respiration noted  Abdomen: Soft, gravid, appropriate for gestational age. Pain/Pressure: Present     Pelvic:  Cervical exam deferred        Extremities: Normal range of motion.  Edema: Trace  Mental Status: Normal mood and affect. Normal behavior. Normal judgment and thought content.   Urinalysis:      Assessment and Plan:  Pregnancy: G1P0 at 4365w4d  1. Supervision of normal first pregnancy, antepartum Stable Labor precautions IOL scheduled AFI/NST next visit  Term labor symptoms and general obstetric precautions including but not limited to vaginal bleeding, contractions, leaking of fluid and fetal movement were reviewed in detail with the patient. Please refer to After Visit Summary for other counseling recommendations.  No follow-ups on file.   Hermina StaggersErvin, Adrain Butrick L, MD

## 2017-08-08 ENCOUNTER — Telehealth (HOSPITAL_COMMUNITY): Payer: Self-pay | Admitting: *Deleted

## 2017-08-08 NOTE — Telephone Encounter (Signed)
Preadmission screen  

## 2017-08-09 ENCOUNTER — Encounter (HOSPITAL_COMMUNITY): Payer: Self-pay | Admitting: *Deleted

## 2017-08-09 ENCOUNTER — Inpatient Hospital Stay (HOSPITAL_COMMUNITY)
Admission: AD | Admit: 2017-08-09 | Discharge: 2017-08-11 | DRG: 807 | Disposition: A | Discharge status: Home / Self Care | Payer: Federal, State, Local not specified - PPO | Attending: Obstetrics and Gynecology | Admitting: Obstetrics and Gynecology

## 2017-08-09 DIAGNOSIS — Z3A39 39 weeks gestation of pregnancy: Secondary | ICD-10-CM | POA: Diagnosis not present

## 2017-08-09 DIAGNOSIS — Z3483 Encounter for supervision of other normal pregnancy, third trimester: Secondary | ICD-10-CM | POA: Diagnosis present

## 2017-08-09 DIAGNOSIS — Z23 Encounter for immunization: Secondary | ICD-10-CM | POA: Diagnosis not present

## 2017-08-09 DIAGNOSIS — Z87891 Personal history of nicotine dependence: Secondary | ICD-10-CM

## 2017-08-09 DIAGNOSIS — O479 False labor, unspecified: Secondary | ICD-10-CM | POA: Diagnosis present

## 2017-08-09 DIAGNOSIS — Z3400 Encounter for supervision of normal first pregnancy, unspecified trimester: Secondary | ICD-10-CM

## 2017-08-09 LAB — CBC
HEMATOCRIT: 37.1 % (ref 36.0–46.0)
HEMOGLOBIN: 13.3 g/dL (ref 12.0–15.0)
MCH: 30.2 pg (ref 26.0–34.0)
MCHC: 35.8 g/dL (ref 30.0–36.0)
MCV: 84.1 fL (ref 78.0–100.0)
Platelets: 182 10*3/uL (ref 150–400)
RBC: 4.41 MIL/uL (ref 3.87–5.11)
RDW: 13.2 % (ref 11.5–15.5)
WBC: 9.6 10*3/uL (ref 4.0–10.5)

## 2017-08-09 LAB — TYPE AND SCREEN
ABO/RH(D): O POS
Antibody Screen: NEGATIVE

## 2017-08-09 LAB — ABO/RH: ABO/RH(D): O POS

## 2017-08-09 MED ORDER — ACETAMINOPHEN 325 MG PO TABS: 650.0000 mg | ORAL_TABLET | ORAL | Status: DC | PRN

## 2017-08-09 MED ORDER — DIPHENHYDRAMINE HCL 25 MG PO CAPS: 25.0000 mg | ORAL_CAPSULE | Freq: Four times a day (QID) | ORAL | Status: DC | PRN

## 2017-08-09 MED ORDER — SENNOSIDES-DOCUSATE SODIUM 8.6-50 MG PO TABS
2.0000 | ORAL_TABLET | ORAL | Status: DC
  Administered 2017-08-10 – 2017-08-11 (×2): 2 via ORAL
  Filled 2017-08-09 (×2): qty 2

## 2017-08-09 MED ORDER — IBUPROFEN 600 MG PO TABS
600.0000 mg | ORAL_TABLET | Freq: Four times a day (QID) | ORAL | Status: DC
  Administered 2017-08-09 – 2017-08-11 (×9): 600 mg via ORAL
  Filled 2017-08-09 (×8): qty 1

## 2017-08-09 MED ORDER — LACTATED RINGERS IV SOLN: INTRAVENOUS | Status: DC

## 2017-08-09 MED ORDER — MISOPROSTOL 200 MCG PO TABS
1000.0000 ug | ORAL_TABLET | Freq: Once | ORAL | Status: AC
  Administered 2017-08-09: 1000 ug via RECTAL
  Filled 2017-08-09: qty 5

## 2017-08-09 MED ORDER — PRENATAL MULTIVITAMIN CH
1.0000 | ORAL_TABLET | Freq: Every day | ORAL | Status: DC
  Administered 2017-08-10 – 2017-08-11 (×2): 1 via ORAL
  Filled 2017-08-09 (×2): qty 1

## 2017-08-09 MED ORDER — FENTANYL CITRATE (PF) 100 MCG/2ML IJ SOLN
100.0000 ug | INTRAMUSCULAR | Status: DC | PRN
  Administered 2017-08-09: 100 ug via INTRAVENOUS
  Filled 2017-08-09: qty 2

## 2017-08-09 MED ORDER — ACETAMINOPHEN 325 MG PO TABS
650.0000 mg | ORAL_TABLET | ORAL | Status: DC | PRN
  Administered 2017-08-09 – 2017-08-10 (×2): 650 mg via ORAL
  Filled 2017-08-09 (×2): qty 2

## 2017-08-09 MED ORDER — OXYCODONE-ACETAMINOPHEN 5-325 MG PO TABS: 1.0000 | ORAL_TABLET | ORAL | Status: DC | PRN

## 2017-08-09 MED ORDER — TETANUS-DIPHTH-ACELL PERTUSSIS 5-2.5-18.5 LF-MCG/0.5 IM SUSP: 0.5000 mL | Freq: Once | INTRAMUSCULAR | Status: DC

## 2017-08-09 MED ORDER — LACTATED RINGERS IV SOLN: 500.0000 mL | INTRAVENOUS | Status: DC | PRN

## 2017-08-09 MED ORDER — ONDANSETRON HCL 4 MG/2ML IJ SOLN: 4.0000 mg | INTRAMUSCULAR | Status: DC | PRN

## 2017-08-09 MED ORDER — COCONUT OIL OIL
1.0000 "application " | TOPICAL_OIL | Status: DC | PRN
  Administered 2017-08-10: 1 via TOPICAL
  Filled 2017-08-09: qty 120

## 2017-08-09 MED ORDER — DIBUCAINE 1 % RE OINT: 1.0000 "application " | TOPICAL_OINTMENT | RECTAL | Status: DC | PRN

## 2017-08-09 MED ORDER — BENZOCAINE-MENTHOL 20-0.5 % EX AERO
1.0000 "application " | INHALATION_SPRAY | CUTANEOUS | Status: DC | PRN
  Administered 2017-08-09: 1 via TOPICAL
  Filled 2017-08-09: qty 56

## 2017-08-09 MED ORDER — OXYCODONE-ACETAMINOPHEN 5-325 MG PO TABS: 2.0000 | ORAL_TABLET | ORAL | Status: DC | PRN

## 2017-08-09 MED ORDER — SOD CITRATE-CITRIC ACID 500-334 MG/5ML PO SOLN: 30.0000 mL | ORAL | Status: DC | PRN

## 2017-08-09 MED ORDER — LIDOCAINE HCL (PF) 1 % IJ SOLN
30.0000 mL | INTRAMUSCULAR | Status: DC | PRN
  Filled 2017-08-09: qty 30

## 2017-08-09 MED ORDER — SIMETHICONE 80 MG PO CHEW: 80.0000 mg | CHEWABLE_TABLET | ORAL | Status: DC | PRN

## 2017-08-09 MED ORDER — ONDANSETRON HCL 4 MG/2ML IJ SOLN: 4.0000 mg | Freq: Four times a day (QID) | INTRAMUSCULAR | Status: DC | PRN

## 2017-08-09 MED ORDER — ONDANSETRON HCL 4 MG PO TABS: 4.0000 mg | ORAL_TABLET | ORAL | Status: DC | PRN

## 2017-08-09 MED ORDER — OXYTOCIN BOLUS FROM INFUSION
500.0000 mL | Freq: Once | INTRAVENOUS | Status: AC
  Administered 2017-08-09: 500 mL via INTRAVENOUS

## 2017-08-09 MED ORDER — OXYTOCIN 40 UNITS IN LACTATED RINGERS INFUSION - SIMPLE MED
2.5000 [IU]/h | INTRAVENOUS | Status: DC
  Filled 2017-08-09: qty 1000

## 2017-08-09 MED ORDER — WITCH HAZEL-GLYCERIN EX PADS: 1.0000 "application " | MEDICATED_PAD | CUTANEOUS | Status: DC | PRN

## 2017-08-09 MED ORDER — ZOLPIDEM TARTRATE 5 MG PO TABS
5.0000 mg | ORAL_TABLET | Freq: Every evening | ORAL | Status: DC | PRN
Start: 2017-08-09 — End: 2017-08-11

## 2017-08-09 NOTE — Progress Notes (Signed)
Patient ID: Alexandra Hart, female   DOB: 06/09/1990, 27 y.o.   MRN: [id number]  Feeling the urge to push; got into the tub a few minutes ago  VSS, afeb  FHR 120s, +LTV Ctx q 2 mins Cx C/C/vtx +2, bag intact  IUP@term  Transition  Beginning to push Expect SVD Steward DroneVeronica Rogers CNM called for delivery (needs WB certification)  Cam HaiSHAW, KIMBERLY CNM 08/09/2017 11:32 AM

## 2017-08-09 NOTE — H&P (Addendum)
Alexandra Hart is a 27 y.o. female G1P0 at 3340w6d by LMP presenting for active labor.  Pregnancy Complications OB Office: [facility] Complications: None  OB History    Gravida  1   Para      Term      Preterm      AB      Living        SAB      TAB      Ectopic      Multiple      Live Births             Past Medical History:  Diagnosis Date  . Headache   . Vaginal Pap smear, abnormal    Past Surgical History:  Procedure Laterality Date  . NO PAST SURGERIES     Family History: family history includes Asthma in her maternal grandmother; Diabetes in her maternal grandmother; Hypertension in her maternal grandmother; Miscarriages / [place] in her sister; Varicose Veins in her maternal grandmother and mother. Social History:  reports that she quit smoking about 8 months ago. Her smoking use included cigarettes. She smoked 0.50 packs per day. She has never used smokeless tobacco. She reports that she does not drink alcohol or use drugs.     Maternal Diabetes: No Genetic Screening: Normal Maternal Ultrasounds/Referrals: Normal Fetal Ultrasounds or other Referrals:  None Maternal Substance Abuse:  Yes:  Type: Smoker Significant Maternal Medications:  None Significant Maternal Lab Results:  None Other Comments:  None  Review of Systems  Constitutional: Negative for chills and fever.  Eyes: Negative.   Respiratory: Negative.   Cardiovascular: Negative.   Genitourinary:       Pink Urine   Maternal Medical History:  Reason for admission: Contractions.   Contractions: Onset was 6-12 hours ago.    Fetal activity: Perceived fetal activity is normal.    Prenatal complications: No bleeding.   Prenatal Complications - Diabetes: none.    Dilation: 7 Effacement (%): 100 Exam by:: weston,rn Blood pressure 132/71, pulse 94, resp. rate 18, height 5\' 1"  (1.549 m), weight 161 lb (73 kg), last menstrual period 11/03/2016. Exam Physical Exam  Constitutional: She  is oriented to person, place, and time. No distress.  Neck: No JVD present.  Cardiovascular: Normal rate.  Respiratory: Effort normal.  Neurological: She is alert and oriented to person, place, and time.    Prenatal labs: ABO, Rh: O/Positive/-- (11/19 1007) Antibody: Negative (11/19 1007) Rubella: 4.92 (11/19 1007) RPR: Non Reactive (03/11 1115)  HBsAg: Negative (11/19 1007)  HIV: Non Reactive (03/11 1115)  GBS: Negative (05/13 1652)   Assessment/Plan: Alexandra Hart is a 27 y.o. female G1P0 at 2940w6d by LMP presenting for active labor.  #Labor: progressing well, wanting a water birth #Pain: No epidural, water birth #ID: GBS neg #MOF: Breast #MOC: Unsure  #Circ: N/A  Garnette GunnerAaron B Thompson 08/09/2017, 10:32 AM  CNM attestation:  I have seen and examined this patient; I agree with above documentation in the resident's note.   Alexandra Hart is a 27 y.o. G1P0 here for SOL; desiring waterbirth  PE: BP 132/71   Pulse 94   Resp 18   Ht 5\' 1"  (1.549 m)   Wt 73 kg (161 lb)   LMP 11/03/2016 (Approximate)   BMI 30.42 kg/m  Gen: coping well with ctx Resp: normal effort, no distress Abd: gravid  ROS, labs, PMH reviewed  [facility]: [facility] Expectant management Saline locked placed Planning on waterbirth Anticipate SVD  SHAW,  KIMBERLY CNM 08/09/2017, 10:54 AM

## 2017-08-09 NOTE — Lactation Note (Signed)
This note was copied from a baby's chart. Lactation Consultation Note  Patient Name: Alexandra Hart IRWER'XToday's Date: 08/09/2017 Reason for consult: Initial assessment;1st time breastfeeding;Primapara;Term  P1 mother whose infant is now 5411 hours old.  Mother is requesting help for latch assistance.    Upon assessment mother's breasts are soft and non tender.  Her nipples are short shafted bilaterally and her left nipple is slightly abraded from a previous poor latch.  Infant sucked on my gloved finger well but wanted to tongue thrust and suck training performed for a couple of minutes.  Encouraged mother to always wait for a wide open mouth before attempting to latch infant.  Assisted baby to latch in the football hold on the left breast.  Mother felt pinching with first latch so attempted a second latch with success.  Mother stated no pain.  Breast compressions taught and baby maintained a good rhythmic suck with periodic encouragement to stay awake.    Reminded mother to continue STS while awake, breast massage and hand expression.  Mother did a return demonstration with hand expression and was able to express colostrum.  Colostrum container provided to collect any EBM mother may obtain with hand expression.  1 ml EBM spoon fed back to baby.    Breast shells and manual pump given with instructions for use.  Comfort gels provided for sore nipples and mother will call as needed for assistance.  Mom made aware of O/P services, breastfeeding support groups, community resources, and our phone # for post-discharge questions.  Father present and interested in learning how to help.   Maternal Data Formula Feeding for Exclusion: No Has patient been taught Hand Expression?: Yes Does the patient have breastfeeding experience prior to this delivery?: No  Feeding Feeding Type: Breast Fed Length of feed: 25 min  LATCH Score Latch: Grasps breast easily, tongue down, lips flanged, rhythmical  sucking.  Audible Swallowing: A few with stimulation  Type of Nipple: Everted at rest and after stimulation(short shafted bilaterally)  Comfort (Breast/Nipple): Soft / non-tender  Hold (Positioning): Assistance needed to correctly position infant at breast and maintain latch.  LATCH Score: 8  Interventions Interventions: Breast feeding basics reviewed;Assisted with latch;Skin to skin;Breast massage;Hand express;Breast compression;Adjust position;Support pillows;Position options;Hand pump;Comfort gels;Shells  Lactation Tools Discussed/Used Tools: Shells;Pump;Comfort gels Shell Type: Inverted Breast pump type: Manual   Consult Status Consult Status: Follow-up Date: 08/10/17 Follow-up type: In-patient    Alexandra Hart 08/09/2017, 11:35 PM

## 2017-08-10 LAB — RPR: RPR: NONREACTIVE

## 2017-08-10 NOTE — Progress Notes (Signed)
POSTPARTUM PROGRESS NOTE  Post Partum Day 1 Subjective:  Alexandra Hart is a 27 y.o. G1P1001 5036w6d s/p SVD.  No acute events overnight.  Pt denies problems with ambulating, voiding or po intake.  She denies nausea or vomiting.  Pain is well controlled.  She has had flatus. She has not had bowel movement.  Lochia Moderate.   Objective: Blood pressure (!) 119/56, pulse 77, temperature 98.9 F (37.2 C), temperature source Oral, resp. rate 18, height 5\' 1"  (1.549 m), weight 161 lb (73 kg), last menstrual period 11/03/2016, unknown if currently breastfeeding.  Physical Exam:  General: alert, cooperative and no distress Lochia:normal flow Chest: CTAB Heart: RRR no m/r/g Abdomen: +BS, soft, nontender,  Uterine Fundus: firm DVT Evaluation: No calf swelling or tenderness Extremities: edema  Recent Labs    08/09/17 1002  HGB 13.3  HCT 37.1    Assessment/Plan:  ASSESSMENT: Alexandra Fraisedrien Summerlin is a 27 y.o. G1P1001 3936w6d s/p SVD  Plan for discharge tomorrow   LOS: 1 day   Garnette GunnerAaron B Teneisha Gignac, MD 08/10/2017, 8:04 AM

## 2017-08-11 MED ORDER — IBUPROFEN 600 MG PO TABS: 600.0000 mg | ORAL_TABLET | Freq: Four times a day (QID) | ORAL | 0 refills | Status: DC

## 2017-08-11 NOTE — Lactation Note (Signed)
This note was copied from a baby's chart. Lactation Consultation Note; Mother reports that she attempt to breastfeed infant but she clamped her lips down and was not showing feeding cues. Offered to assist mother to chair for good positioning. Infant placed skin to skin and rouse quickly to drops of milk on mothers nipple. Infant latched well with good depth. Observed burst of rhythmic suckling and audible swallows. Parents were taught to listen and observed good milk transfer. Advised mother to cue base feed and feed at least 8-12 times in 24 hours. Advised to do frequent skin to skin.  Mother has a tiny crack on the Rt nipple , advised mother to use ebm on nipples and to use comfort gels. Suggested that mother rotate positions frequently to prevent more nipple soreness. Mother has a DEBP at home and a harmony hand pump. Mother is active with WIC. [facility]. Discussed treatment and prevention of engorgement. Parents receptive to all teaching.   Patient Name: Alexandra Hart Reason for consult: Follow-up assessment   Maternal Data    Feeding Feeding Type: Breast Fed Length of feed: 0 min  LATCH Score Latch: Grasps breast easily, tongue down, lips flanged, rhythmical sucking.  Audible Swallowing: Spontaneous and intermittent  Type of Nipple: Everted at rest and after stimulation  Comfort (Breast/Nipple): Filling, red/small blisters or bruises, mild/mod discomfort  Hold (Positioning): Assistance needed to correctly position infant at breast and maintain latch.  LATCH Score: 8  Interventions Interventions: Assisted with latch;Skin to skin;Hand express;Breast compression;Adjust position;Support pillows;Position options;Expressed milk;Comfort gels;Hand pump  Lactation Tools Discussed/Used     Consult Status Consult Status: Complete    Michel BickersKendrick, Alexandra Hart Hart, 12:07 PM

## 2017-08-11 NOTE — Discharge Summary (Signed)
OB Discharge Summary     Patient Name: Alexandra Hart DOB: 03/30/1990 MRN: [id number]  Date of admission: 08/09/2017 Delivering MD: Sharyon CableOGERS, VERONICA C   Date of discharge: 08/11/2017  Admitting diagnosis: LABOR Intrauterine pregnancy: 386w6d     Secondary diagnosis:  Active Problems:   SVD (spontaneous vaginal delivery)  Additional problems: None     Discharge diagnosis: Term Pregnancy Delivered                                                                                                Post partum procedures:NOne  Augmentation: None  Complications: None  Hospital course:  Onset of Labor With Vaginal Delivery     27 y.o. yo G1P1001 at 616w6d was admitted in Latent Labor on 08/09/2017. Patient had an uncomplicated labor course as follows:  Membrane Rupture Time/Date: 11:45 AM ,08/09/2017   Intrapartum Procedures: Episiotomy: None [1]                                         Lacerations:  Labial [10]  Patient had a delivery of a Viable infant. 08/09/2017  Information for the patient's newborn:  Alexandra Hart [[id number]]  Delivery Method: Vag-Spont    Pateint had an uncomplicated postpartum course.  She is ambulating, tolerating a regular diet, passing flatus, and urinating well. Patient is discharged home in stable condition on 08/13/17.   Physical exam  Vitals:   08/10/17 0340 08/10/17 0812 08/10/17 1730 08/11/17 0551  BP: (!) 119/56 112/66 128/61 115/63  Pulse: 77 67 86 71  Resp: 18 20 18 18   Temp: 98.9 F (37.2 C) 97.8 F (36.6 C) 99 F (37.2 C) 98.2 F (36.8 C)  TempSrc: Oral  Oral Oral  SpO2:   100% 99%  Weight:      Height:       General: alert, cooperative and no distress Lochia: appropriate Uterine Fundus: firm Incision: N/A DVT Evaluation: No evidence of DVT seen on physical exam. Labs: Lab Results  Component Value Date   WBC 9.6 08/09/2017   HGB 13.3 08/09/2017   HCT 37.1 08/09/2017   MCV 84.1 08/09/2017   PLT 182 08/09/2017   No flowsheet data  found.  Discharge instruction: per After Visit Summary and "Baby and Me Booklet".  After visit meds:  Allergies as of 08/11/2017      Reactions   Shrimp [shellfish Allergy] Nausea And Vomiting   Tomato Itching, Rash      Medication List    STOP taking these medications   COMFORT FIT MATERNITY SUPP MED Misc     TAKE these medications   Butalbital-APAP-Caffeine 50-325-40 MG capsule Take 1-2 capsules by mouth every 6 (six) hours as needed for headache.   ibuprofen 600 MG tablet Commonly known as:  ADVIL,MOTRIN Take 1 tablet (600 mg total) by mouth every 6 (six) hours.   PNV PO Take 1 tablet by mouth daily.   TYLENOL 500 MG tablet Generic drug:  acetaminophen Take 500 mg by mouth every 6 (  six) hours as needed for mild pain or headache.       Diet: routine diet  Activity: Advance as tolerated. Pelvic rest for 6 weeks.   Outpatient follow up:4 weeks Follow up Appt: Future Appointments  Date Time Provider Department Center  09/09/2017  8:30 AM Sharyon Cable, CNM [facility] None   Follow up Visit:No follow-ups on file.  Postpartum contraception: Undecided  Newborn Data: Live born female  Birth Weight: 6 lb 11.6 oz (3050 g) APGAR: 8, 9  Newborn Delivery   Birth date/time:  08/09/2017 11:45:00 Delivery type:  Vaginal, Spontaneous     Baby Feeding: Breast Disposition:home with mother   08/11/2017 Dorathy Kinsman, CNM

## 2017-08-11 NOTE — Discharge Instructions (Signed)
Postpartum Care After Vaginal Delivery °The period of time right after you deliver your newborn is called the postpartum period. °What kind of medical care will I receive? °· You may continue to receive fluids and medicines through an IV tube inserted into one of your veins. °· If an incision was made near your vagina (episiotomy) or if you had some vaginal tearing during delivery, cold compresses may be placed on your episiotomy or your tear. This helps to reduce pain and swelling. °· You may be given a squirt bottle to use when you go to the bathroom. You may use this until you are comfortable wiping as usual. To use the squirt bottle, follow these steps: °? Before you urinate, fill the squirt bottle with warm water. Do not use hot water. °? After you urinate, while you are sitting on the toilet, use the squirt bottle to rinse the area around your urethra and vaginal opening. This rinses away any urine and blood. °? You may do this instead of wiping. As you start healing, you may use the squirt bottle before wiping yourself. Make sure to wipe gently. °? Fill the squirt bottle with clean water every time you use the bathroom. °· You will be given sanitary pads to wear. °How can I expect to feel? °· You may not feel the need to urinate for several hours after delivery. °· You will have some soreness and pain in your abdomen and vagina. °· If you are breastfeeding, you may have uterine contractions every time you breastfeed for up to several weeks postpartum. Uterine contractions help your uterus return to its normal size. °· It is normal to have vaginal bleeding (lochia) after delivery. The amount and appearance of lochia is often similar to a menstrual period in the first week after delivery. It will gradually decrease over the next few weeks to a dry, yellow-brown discharge. For most women, lochia stops completely by 6-8 weeks after delivery. Vaginal bleeding can vary from woman to woman. °· Within the first few  days after delivery, you may have breast engorgement. This is when your breasts feel heavy, full, and uncomfortable. Your breasts may also throb and feel hard, tightly stretched, warm, and tender. After this occurs, you may have milk leaking from your breasts. Your health care provider can help you relieve discomfort due to breast engorgement. Breast engorgement should go away within a few days. °· You may feel more sad or worried than normal due to hormonal changes after delivery. These feelings should not last more than a few days. If these feelings do not go away after several days, speak with your health care provider. °How should I care for myself? °· Tell your health care provider if you have pain or discomfort. °· Drink enough water to keep your urine clear or pale yellow. °· Wash your hands thoroughly with soap and water for at least 20 seconds after changing your sanitary pads, after using the toilet, and before holding or feeding your baby. °· If you are not breastfeeding, avoid touching your breasts a lot. Doing this can make your breasts produce more milk. °· If you become weak or lightheaded, or you feel like you might faint, ask for help before: °? Getting out of bed. °? Showering. °· Change your sanitary pads frequently. Watch for any changes in your flow, such as a sudden increase in volume, a change in color, the passing of large blood clots. If you pass a blood clot from your vagina, save it   to show to your health care provider. Do not flush blood clots down the toilet without having your health care provider look at them. °· Make sure that all your vaccinations are up to date. This can help protect you and your baby from getting certain diseases. You may need to have immunizations done before you leave the hospital. °· If desired, talk with your health care provider about methods of family planning or birth control (contraception). °How can I start bonding with my baby? °Spending as much time as  possible with your baby is very important. During this time, you and your baby can get to know each other and develop a bond. Having your baby stay with you in your room (rooming in) can give you time to get to know your baby. Rooming in can also help you become comfortable caring for your baby. Breastfeeding can also help you bond with your baby. °How can I plan for returning home with my baby? °· Make sure that you have a car seat installed in your vehicle. °? Your car seat should be checked by a certified car seat installer to make sure that it is installed safely. °? Make sure that your baby fits into the car seat safely. °· Ask your health care provider any questions you have about caring for yourself or your baby. Make sure that you are able to contact your health care provider with any questions after leaving the hospital. °This information is not intended to replace advice given to you by your health care provider. Make sure you discuss any questions you have with your health care provider. °Document Released: 12/17/2006 Document Revised: 07/25/2015 Document Reviewed: 01/24/2015 °Elsevier Interactive Patient Education © 2018 [employer] ° ° °Contraception Choices °Contraception, also called birth control, refers to methods or devices that prevent pregnancy. °Hormonal methods °Contraceptive implant °A contraceptive implant is a thin, plastic tube that contains a hormone. It is inserted into the upper part of the arm. It can remain in place for up to 3 years. °Progestin-only injections °Progestin-only injections are injections of progestin, a synthetic form of the hormone progesterone. They are given every 3 months by a health care provider. °Birth control pills °Birth control pills are pills that contain hormones that prevent pregnancy. They must be taken once a day, preferably at the same time each day. °Birth control patch °The birth control patch contains hormones that prevent pregnancy. It is placed on  the skin and must be changed once a week for three weeks and removed on the fourth week. A prescription is needed to use this method of contraception. °Vaginal ring °A vaginal ring contains hormones that prevent pregnancy. It is placed in the vagina for three weeks and removed on the fourth week. After that, the process is repeated with a new ring. A prescription is needed to use this method of contraception. °Emergency contraceptive °Emergency contraceptives prevent pregnancy after unprotected sex. They come in pill form and can be taken up to 5 days after sex. They work best the sooner they are taken after having sex. Most emergency contraceptives are available without a prescription. This method should not be used as your only form of birth control. °Barrier methods °Female condom °A female condom is a thin sheath that is worn over the penis during sex. Condoms keep sperm from going inside a woman's body. They can be used with a spermicide to increase their effectiveness. They should be disposed after a single use. °Female condom °A female condom   is a soft, loose-fitting sheath that is put into the vagina before sex. The condom keeps sperm from going inside a woman's body. They should be disposed after a single use. °Diaphragm °A diaphragm is a soft, dome-shaped barrier. It is inserted into the vagina before sex, along with a spermicide. The diaphragm blocks sperm from entering the uterus, and the spermicide kills sperm. A diaphragm should be left in the vagina for 6-8 hours after sex and removed within 24 hours. °A diaphragm is prescribed and fitted by a health care provider. A diaphragm should be replaced every 1-2 years, after giving birth, after gaining more than 15 lb (6.8 kg), and after pelvic surgery. °Cervical cap °A cervical cap is a round, soft latex or plastic cup that fits over the cervix. It is inserted into the vagina before sex, along with spermicide. It blocks sperm from entering the uterus. The cap  should be left in place for 6-8 hours after sex and removed within 48 hours. A cervical cap must be prescribed and fitted by a health care provider. It should be replaced every 2 years. °Sponge °A sponge is a soft, circular piece of polyurethane foam with spermicide on it. The sponge helps block sperm from entering the uterus, and the spermicide kills sperm. To use it, you make it wet and then insert it into the vagina. It should be inserted before sex, left in for at least 6 hours after sex, and removed and thrown away within 30 hours. °Spermicides °Spermicides are chemicals that kill or block sperm from entering the cervix and uterus. They can come as a cream, jelly, suppository, foam, or tablet. A spermicide should be inserted into the vagina with an applicator at least 10-15 minutes before sex to allow time for it to work. The process must be repeated every time you have sex. Spermicides do not require a prescription. °Intrauterine contraception °Intrauterine device (IUD) °An IUD is a T-shaped device that is put in a woman's uterus. There are two types: °· Hormone IUD.This type contains progestin, a synthetic form of the hormone progesterone. This type can stay in place for 3-5 years. °· Copper IUD.This type is wrapped in copper wire. It can stay in place for 10 years. ° °Permanent methods of contraception °Female tubal ligation °In this method, a woman's fallopian tubes are sealed, tied, or blocked during surgery to prevent eggs from traveling to the uterus. °Hysteroscopic sterilization °In this method, a small, flexible insert is placed into each fallopian tube. The inserts cause scar tissue to form in the fallopian tubes and block them, so sperm cannot reach an egg. The procedure takes about 3 months to be effective. Another form of birth control must be used during those 3 months. °Female sterilization °This is a procedure to tie off the tubes that carry sperm (vasectomy). After the procedure, the man can  still ejaculate fluid (semen). °Natural planning methods °Natural family planning °In this method, a couple does not have sex on days when the woman could become pregnant. °Calendar method °This means keeping track of the length of each menstrual cycle, identifying the days when pregnancy can happen, and not having sex on those days. °Ovulation method °In this method, a couple avoids sex during ovulation. °Symptothermal method °This method involves not having sex during ovulation. The woman typically checks for ovulation by watching changes in her temperature and in the consistency of cervical mucus. °Post-ovulation method °In this method, a couple waits to have sex until after ovulation. °Summary °·   Contraception, also called birth control, means methods or devices that prevent pregnancy. °· Hormonal methods of contraception include implants, injections, pills, patches, vaginal rings, and emergency contraceptives. °· Barrier methods of contraception can include female condoms, female condoms, diaphragms, cervical caps, sponges, and spermicides. °· There are two types of IUDs (intrauterine devices). An IUD can be put in a woman's uterus to prevent pregnancy for 3-5 years. °· Permanent sterilization can be done through a procedure for males, females, or both. °· Natural family planning methods involve not having sex on days when the woman could become pregnant. °This information is not intended to replace advice given to you by your health care provider. Make sure you discuss any questions you have with your health care provider. °Document Released: 02/19/2005 Document Revised: 03/24/2016 Document Reviewed: 03/24/2016 °Elsevier Interactive Patient Education © 2018 [employer] ° °

## 2017-08-15 ENCOUNTER — Encounter: Payer: Federal, State, Local not specified - PPO | Admitting: Obstetrics and Gynecology

## 2017-08-17 ENCOUNTER — Inpatient Hospital Stay (HOSPITAL_COMMUNITY): Admission: RE | Admit: 2017-08-17 | Payer: Federal, State, Local not specified - PPO | Source: Ambulatory Visit

## 2017-09-09 ENCOUNTER — Encounter: Payer: Self-pay | Admitting: Certified Nurse Midwife

## 2017-09-09 ENCOUNTER — Ambulatory Visit (INDEPENDENT_AMBULATORY_CARE_PROVIDER_SITE_OTHER): Payer: Federal, State, Local not specified - PPO | Admitting: Certified Nurse Midwife

## 2017-09-09 DIAGNOSIS — Z3042 Encounter for surveillance of injectable contraceptive: Secondary | ICD-10-CM

## 2017-09-09 DIAGNOSIS — Z1389 Encounter for screening for other disorder: Secondary | ICD-10-CM | POA: Diagnosis not present

## 2017-09-09 DIAGNOSIS — K5900 Constipation, unspecified: Secondary | ICD-10-CM

## 2017-09-09 DIAGNOSIS — Z30013 Encounter for initial prescription of injectable contraceptive: Secondary | ICD-10-CM

## 2017-09-09 MED ORDER — MEDROXYPROGESTERONE ACETATE 150 MG/ML IM SUSP
150.0000 mg | INTRAMUSCULAR | Status: DC
  Administered 2017-09-09 – 2017-12-02 (×2): 150 mg via INTRAMUSCULAR

## 2017-09-09 NOTE — Progress Notes (Signed)
Post Partum Exam  Alexandra Hart is a 27 y.o. 71P1001 female who presents for a postpartum visit. She is 4 weeks postpartum following a spontaneous vaginal delivery- water birth. I have fully reviewed the prenatal and intrapartum course. The delivery was at 39.6 gestational weeks.  Anesthesia: none. Postpartum course has been good. Baby's course has been good. Baby is feeding by breast. Bleeding no bleeding. Bowel function is abnormal: cconstipated. Bladder function is normal. Patient is not sexually active. Contraception method is none. Postpartum depression screening:neg  The following portions of the patient's history were reviewed and updated as appropriate: allergies, current medications, past family history, past medical history and problem list. Last pap smear done 01/21/2017 and was Normal  Review of Systems Pertinent items noted in HPI and remainder of comprehensive ROS otherwise negative.    Objective:  Blood pressure 130/81, pulse 73, height 5\' 5"  (1.651 m), weight 144 lb 9.6 oz (65.6 kg), last menstrual period 11/03/2016, currently breastfeeding.  General:  alert, cooperative and no distress   Breasts:  inspection negative, no nipple discharge or bleeding, no masses or nodularity palpable  Lungs: clear to auscultation bilaterally  Heart:  regular rate and rhythm and S1, S2 normal  Abdomen: soft, non-tender; bowel sounds normal; no masses,  no organomegaly   Vulva:  not evaluated  Vagina: not evaluated  Cervix:  not evaluated  Corpus: not examined  Adnexa:  not evaluated  Rectal Exam: Not performed.        Assessment/Plan:  1. Postpartum care and examination - Normal postpartum exam. Pap smear not done at today's visit. Pap smear not needed until 2021.  -Patient is down to pre-pregnant weight of 144lbs   2. Constipation, unspecified constipation type -Patient reports not having a BM regularly- reports going every 2 days. She states this was similar to pattern prior to  getting pregnant. Worried about constipation due to it affecting baby.  -Educated and discussed what she eats. She reports not eating a lot of fruits and veggies but eating carbs and fast food. Educated on the need to make changes in diet, add fiber and increase amount of dark leafy greens she consumes. Patient verbalizes understanding.  -AVS given with information on eating and drinking changes and instructions.  3. Encounter for prescription for depo-Provera -Patient was on Depo prior to getting pregnant with no issues or concerns. Reports wanting to get back on Depo for birth control method.  -Educated on Depo and the need to receive injections every 3 months- patient verbalizes understanding and wants to start birth control method today.  -UPT negative in office  -Contraception: Depo-Provera injections -medroxyPROGESTERone (DEPO-PROVERA) injection 150mg    Follow up in: 3 months for Depo injection or as needed.   Sharyon CableVeronica C Cobain Morici, CNM 09/09/17, 9:16 AM

## 2017-09-09 NOTE — Patient Instructions (Signed)

## 2017-09-12 DIAGNOSIS — Z3481 Encounter for supervision of other normal pregnancy, first trimester: Secondary | ICD-10-CM

## 2017-11-09 ENCOUNTER — Ambulatory Visit (HOSPITAL_COMMUNITY)
Admission: EM | Admit: 2017-11-09 | Discharge: 2017-11-09 | Disposition: A | Discharge status: Home / Self Care | Payer: Federal, State, Local not specified - PPO | Attending: Internal Medicine | Admitting: Internal Medicine

## 2017-11-09 ENCOUNTER — Encounter (HOSPITAL_COMMUNITY): Payer: Self-pay | Admitting: Emergency Medicine

## 2017-11-09 ENCOUNTER — Other Ambulatory Visit: Payer: Self-pay

## 2017-11-09 DIAGNOSIS — G44209 Tension-type headache, unspecified, not intractable: Secondary | ICD-10-CM

## 2017-11-09 MED ORDER — NAPROXEN 375 MG PO TABS: 375.0000 mg | ORAL_TABLET | Freq: Two times a day (BID) | ORAL | 0 refills | Status: DC

## 2017-11-09 MED ORDER — KETOROLAC TROMETHAMINE 30 MG/ML IJ SOLN
30.0000 mg | Freq: Once | INTRAMUSCULAR | Status: AC
  Administered 2017-11-09: 30 mg via INTRAMUSCULAR

## 2017-11-09 MED ORDER — METOCLOPRAMIDE HCL 5 MG/ML IJ SOLN
INTRAMUSCULAR | Status: AC
Start: 2017-11-09 — End: ?
  Filled 2017-11-09: qty 2

## 2017-11-09 MED ORDER — METOCLOPRAMIDE HCL 5 MG/ML IJ SOLN
5.0000 mg | Freq: Once | INTRAMUSCULAR | Status: AC
  Administered 2017-11-09: 5 mg via INTRAMUSCULAR

## 2017-11-09 MED ORDER — KETOROLAC TROMETHAMINE 30 MG/ML IJ SOLN
INTRAMUSCULAR | Status: AC
  Filled 2017-11-09: qty 1

## 2017-11-09 NOTE — ED Triage Notes (Signed)
Headache started last night.  Pain is in temple and behind both eyes.  No vomiting but feels nauseated.  Patient has a history of migraines.  This headache is a little more than usual and facial pain s unusual for headaches.

## 2017-11-09 NOTE — ED Provider Notes (Signed)
[facility]    CSN: [id number] Arrival date & time: 11/09/17  1524     History   Chief Complaint Chief Complaint  Patient presents with  . Appointment    4:10pm  . Headache    HPI Alexandra Hart is a 27 y.o. female no significant past medical history presenting today for evaluation of headache.  Patient states that her headache began last night, she went to sleep and headache persisted this morning.  Onset has been gradual.  States that she feels the pain behind her eyes as well as wrapping around her head and down into her neck.  She states that she frequently has migraines, and since having birth to her daughter 3 months ago her migraines have been worse in not as manageable with over-the-counter medicine.  Patient is breast-feeding.  She has been using Fioricet without relief.  Denies vision changes.  Has had phonophobia/photophobia.  Mild nausea, but denies vomiting.     Past Medical History:  Diagnosis Date  . Headache   . Supervision of normal first pregnancy, antepartum 12/27/2016    Clinic  [facility] Prenatal Labs Dating   LMP Blood type: O/Positive/-- (11/19 1007) O pos Genetic Screen 1 Screen: nl NT    AFP:  Negative  Antibody:Negative (11/19 1007)Neg Anatomic [place] normal Rubella: 4.92 (11/19 1007)Immune GTT Early:               Third trimester: nl 2 hour RPR: Non Reactive (03/11 1115) NR Flu vaccine 01/21/17 HBsAg: Negative (11/19 1007) Neg TDaP vaccine  05/13/17               . Vaginal Pap smear, abnormal     Patient Active Problem List   Diagnosis Date Noted  . SVD (spontaneous vaginal delivery) 08/09/2017    Past Surgical History:  Procedure Laterality Date  . NO PAST SURGERIES      OB History    Gravida  1   Para  1   Term  1   Preterm      AB      Living  1     SAB      TAB      Ectopic      Multiple  0   Live Births  1            Home Medications    Prior to Admission medications   Medication Sig Start Date End Date  Taking? Authorizing Provider  Butalbital-APAP-Caffeine 50-325-40 MG capsule Take 1-2 capsules by mouth every 6 (six) hours as needed for headache. 07/01/17  Yes Constant, Peggy, MD  acetaminophen (TYLENOL) 500 MG tablet Take 500 mg by mouth every 6 (six) hours as needed for mild pain or headache.     [provider]  ibuprofen (ADVIL,MOTRIN) 600 MG tablet Take 1 tablet (600 mg total) by mouth every 6 (six) hours. Patient not taking: Reported on 09/09/2017 08/11/17   Katrinka Blazing, [place], CNM  naproxen (NAPROSYN) 375 MG tablet Take 1 tablet (375 mg total) by mouth 2 (two) times daily. 11/09/17   Demitrious Mccannon C, PA-C  Prenatal Vit w/Fe-Methylfol-FA (PNV PO) Take 1 tablet by mouth daily.     [provider]    Family History Family History  Problem Relation Age of Onset  . Varicose Veins Mother   . Miscarriages / Stillbirths Sister   . Asthma Maternal Grandmother   . Diabetes Maternal Grandmother   . Hypertension Maternal Grandmother   . Varicose Veins Maternal  Grandmother     Social History Social History   Tobacco Use  . Smoking status: Former Smoker    Packs/day: 0.50    Types: Cigarettes    Last attempt to quit: 11/27/2016    Years since quitting: 0.9  . Smokeless tobacco: Never Used  Substance Use Topics  . Alcohol use: No    Frequency: Never  . Drug use: No     Allergies   Shrimp [shellfish allergy] and Tomato   Review of Systems Review of Systems  Constitutional: Negative for fatigue and fever.  HENT: Negative for congestion, sinus pressure and sore throat.   Eyes: Positive for photophobia. Negative for pain and visual disturbance.  Respiratory: Negative for cough and shortness of breath.   Cardiovascular: Negative for chest pain.  Gastrointestinal: Positive for nausea. Negative for abdominal pain and vomiting.  Genitourinary: Negative for decreased urine volume and hematuria.  Musculoskeletal: Negative for myalgias, neck pain and neck stiffness.    Neurological: Positive for headaches. Negative for dizziness, syncope, facial asymmetry, speech difficulty, weakness, light-headedness and numbness.     Physical Exam Triage Vital Signs ED Triage Vitals  Enc Vitals Group     BP 11/09/17 1614 115/81     Pulse Rate 11/09/17 1614 76     Resp 11/09/17 1614 18     Temp 11/09/17 1614 98.7 F (37.1 C)     Temp Source 11/09/17 1614 Oral     SpO2 11/09/17 1614 98 %     Weight --      Height --      Head Circumference --      Peak Flow --      Pain Score 11/09/17 1611 9     Pain Loc --      Pain Edu? --      Excl. in GC? --    No data found.  Updated Vital Signs BP 115/81 (BP Location: Right Arm)   Pulse 76   Temp 98.7 F (37.1 C) (Oral)   Resp 18   SpO2 98%   Breastfeeding? Yes   Visual Acuity Right Eye Distance:   Left Eye Distance:   Bilateral Distance:    Right Eye Near:   Left Eye Near:    Bilateral Near:     Physical Exam  Constitutional: She is oriented to person, place, and time. She appears well-developed and well-nourished. No distress.  Sitting in dark room  HENT:  Head: Normocephalic and atraumatic.  Bilateral ears without tenderness to palpation of external auricle, tragus and mastoid, EAC's without erythema or swelling, TM's with good bony landmarks and cone of light. Non erythematous.  Oral mucosa pink and moist, no tonsillar enlargement or exudate. Posterior pharynx patent and nonerythematous, no uvula deviation or swelling. Normal phonation.  Eyes: Pupils are equal, round, and reactive to light. Conjunctivae and EOM are normal.  Neck: Neck supple.  Cardiovascular: Normal rate and regular rhythm.  No murmur heard. Pulmonary/Chest: Effort normal and breath sounds normal. No respiratory distress.  Breathing comfortably at rest, CTABL, no wheezing, rales or other adventitious sounds auscultated  Abdominal: Soft. There is no tenderness.  Musculoskeletal: She exhibits no edema.  Neurological: She is  alert and oriented to person, place, and time.  Patient A&O x3, cranial nerves II-XII grossly intact, strength at shoulders, hips and knees 5/5, equal bilaterally, patellar reflex 2+ bilaterally. Gait without abnormality.  Skin: Skin is warm and dry.  Psychiatric: She has a normal mood and affect.  Nursing note and  vitals reviewed.    UC Treatments / Results  Labs (all labs ordered are listed, but only abnormal results are displayed) Labs Reviewed - No data to display  EKG None  Radiology No results found.  Procedures Procedures (including critical care time)  Medications Ordered in UC Medications  metoCLOPramide (REGLAN) injection 5 mg (has no administration in time range)  ketorolac (TORADOL) 30 MG/ML injection 30 mg (has no administration in time range)    Initial Impression / Assessment and Plan / UC Course  I have reviewed the triage vital signs and the nursing notes.  Pertinent labs & imaging results that were available during my care of the patient were reviewed by me and considered in my medical decision making (see chart for details).     Patient 3 months postpartum, breast-feeding, with tension type versus migraine headache.  Patient has been unsuccessful taking Tylenol/butalbital/caffeine without relief, will provide patient with 30 mg injection of Toradol, 5 mg Reglan, up-to-date consulted for both of these, RID less than 10 and appear safe for breast-feeding especially for a one-time dose.  Fill benefit greater than risk.  Still recommended patient to pump and dump for 24 hours afterward.  Recommended patient to continue further headache management with Fioricet first, then moved to NSAIDs.  Following up in the emergency room if headache persisting, worsening, developing vision changes, weakness, dizziness, lightheadedness. Final Clinical Impressions(s) / UC Diagnoses   Final diagnoses:  Acute non intractable tension-type headache     Discharge Instructions      We gave you a shot of Toradol as well as Reglan.  These should begin working in approximately 30 to 40 minutes.  This should be safe with breast-feeding, but if you are able it is always safe to pump and dump for 24 hours afterward and use breast milk that you have already pumped.  Please continue to try and use the headache medicine provided to you by your OB/GYN, if you are not having further relief with this, you may try Naprosyn.  If headache persisting, not improving with treatment, developing change in vision, weakness, dizziness, lightheadedness please go to emergency room for further evaluation of your headache.   ED Prescriptions    Medication Sig Dispense Auth. Provider   naproxen (NAPROSYN) 375 MG tablet Take 1 tablet (375 mg total) by mouth 2 (two) times daily. 10 tablet English Craighead, [place] C, PA-C     Controlled Substance Prescriptions [place] Controlled Substance Registry consulted? Not Applicable   Lew Dawes, [place] 11/09/17 1704

## 2017-11-09 NOTE — Discharge Instructions (Addendum)
We gave you a shot of Toradol as well as Reglan.  These should begin working in approximately 30 to 40 minutes.  This should be safe with breast-feeding, but if you are able it is always safe to pump and dump for 24 hours afterward and use breast milk that you have already pumped.  Please continue to try and use the headache medicine provided to you by your OB/GYN, if you are not having further relief with this, you may try Naprosyn.  If headache persisting, not improving with treatment, developing change in vision, weakness, dizziness, lightheadedness please go to emergency room for further evaluation of your headache.

## 2017-11-15 ENCOUNTER — Telehealth: Payer: Self-pay

## 2017-11-15 NOTE — Telephone Encounter (Signed)
Per Dr. Jolayne Pantheronstant  Unfortunately, we cannot extend her FMLA. However, we can have her evaluated for possible postpartum depression. Please place an order to see Asher MuirJamie if the patient agrees to postpartum depression evaluation.  Attempted to call pt to inform her of the above. No answer. Left message for pt to return call.

## 2017-12-02 ENCOUNTER — Ambulatory Visit (INDEPENDENT_AMBULATORY_CARE_PROVIDER_SITE_OTHER): Payer: Federal, State, Local not specified - PPO | Admitting: *Deleted

## 2017-12-02 VITALS — BP 137/87 | HR 78 | Wt 152.0 lb

## 2017-12-02 DIAGNOSIS — Z3042 Encounter for surveillance of injectable contraceptive: Secondary | ICD-10-CM

## 2017-12-02 MED ORDER — MEDROXYPROGESTERONE ACETATE 150 MG/ML IM SUSP: 150.0000 mg | INTRAMUSCULAR | 2 refills | Status: DC

## 2017-12-02 NOTE — Progress Notes (Signed)
Pt is in office for depo injection. Pt is on time for Depo. Depo was provided by office as Rx was not sent for pt after initial dose. Pt made aware Rx will be sent today and she needs to bring with her to all future appts. Pt advised to RTO 12/16-12/30 for next Depo. Pt states understanding.  BP 137/87   Pulse 78   Wt 152 lb (68.9 kg)   BMI 25.29 kg/m   Administrations This Visit    medroxyPROGESTERone (DEPO-PROVERA) injection 150 mg    Admin Date 12/02/2017 Action Given Dose 150 mg Route Intramuscular Administered By Lanney Gins, CMA

## 2017-12-03 NOTE — Progress Notes (Signed)
I have reviewed the chart and agree with nursing staff's documentation of this patient's encounter.  Catalina Antigua, MD 12/03/2017 10:47 AM

## 2018-02-10 ENCOUNTER — Other Ambulatory Visit: Payer: Self-pay

## 2018-02-10 ENCOUNTER — Ambulatory Visit: Payer: Federal, State, Local not specified - PPO | Admitting: Family Medicine

## 2018-02-10 ENCOUNTER — Encounter: Payer: Self-pay | Admitting: Family Medicine

## 2018-02-10 VITALS — BP 120/78 | HR 67 | Temp 98.1°F | Resp 20 | Ht 61.3 in | Wt 158.0 lb

## 2018-02-10 DIAGNOSIS — Z0000 Encounter for general adult medical examination without abnormal findings: Secondary | ICD-10-CM

## 2018-02-10 DIAGNOSIS — Z0001 Encounter for general adult medical examination with abnormal findings: Secondary | ICD-10-CM | POA: Diagnosis not present

## 2018-02-10 DIAGNOSIS — G8929 Other chronic pain: Secondary | ICD-10-CM

## 2018-02-10 DIAGNOSIS — R51 Headache: Secondary | ICD-10-CM | POA: Diagnosis not present

## 2018-02-10 NOTE — Progress Notes (Signed)
12/9/20193:20 PM  Alexandra Hart 1990/04/06, 27 y.o. female [id number]  Chief Complaint  Patient presents with  . Annual Exam    HPI:   Patient is a 27 y.o. female who presents today for CPE  G1P1, SVD in June 2019, girl, doing well On Depo - happy with method, managed by gyn Last pap Nov 2018 - normal Last STI testing in may 2019 Normal 2GTT Breastfeeding TDaP in march 2019 Flu vaccine in Oct 2019  Having migraines, worsening Pain behind eyes, sensitive to light and sound No aura Lasts hours to days, 2-3 x week Had been taking fioricet during pregnancy Now on naproxen, helps most of the time Mother has migraines   Fall Risk  01/29/2017  Falls in the past year? No     Depression screen PHQ 2/9 02/10/2018  Decreased Interest 0  Down, Depressed, Hopeless 0  PHQ - 2 Score 0    Allergies  Allergen Reactions  . Shrimp [Shellfish Allergy] Nausea And Vomiting  . Tomato Itching and Rash    Prior to Admission medications   Medication Sig Start Date End Date Taking? Authorizing Provider  acetaminophen (TYLENOL) 500 MG tablet Take 500 mg by mouth every 6 (six) hours as needed for mild pain or headache.     [provider]  Butalbital-APAP-Caffeine [phone] MG capsule Take 1-2 capsules by mouth every 6 (six) hours as needed for headache. 07/01/17   Constant, Peggy, MD  ibuprofen (ADVIL,MOTRIN) 600 MG tablet Take 1 tablet (600 mg total) by mouth every 6 (six) hours. Patient not taking: Reported on 09/09/2017 08/11/17   Katrinka Blazing, [place], CNM  medroxyPROGESTERone (DEPO-PROVERA) 150 MG/ML injection Inject 1 mL (150 mg total) into the muscle every 3 (three) months. 12/02/17   Constant, Peggy, MD  naproxen (NAPROSYN) 375 MG tablet Take 1 tablet (375 mg total) by mouth 2 (two) times daily. 11/09/17   Wieters, Hallie C, PA-C  Prenatal Vit w/Fe-Methylfol-FA (PNV PO) Take 1 tablet by mouth daily.     [provider]    Past Medical History:  Diagnosis Date  .  Headache   . Supervision of normal first pregnancy, antepartum 12/27/2016    Clinic  [facility] Prenatal Labs Dating   LMP Blood type: O/Positive/-- (11/19 1007) O pos Genetic Screen 1 Screen: nl NT    AFP:  Negative  Antibody:Negative (11/19 1007)Neg Anatomic [place] normal Rubella: 4.92 (11/19 1007)Immune GTT Early:               Third trimester: nl 2 hour RPR: Non Reactive (03/11 1115) NR Flu vaccine 01/21/17 HBsAg: Negative (11/19 1007) Neg TDaP vaccine  05/13/17               . Vaginal Pap smear, abnormal     Past Surgical History:  Procedure Laterality Date  . NO PAST SURGERIES      Social History   Tobacco Use  . Smoking status: Former Smoker    Packs/day: 0.50    Types: Cigarettes    Last attempt to quit: 11/27/2016    Years since quitting: 1.2  . Smokeless tobacco: Never Used  Substance Use Topics  . Alcohol use: No    Frequency: Never    Family History  Problem Relation Age of Onset  . Varicose Veins Mother   . Miscarriages / Stillbirths Sister   . Asthma Maternal Grandmother   . Diabetes Maternal Grandmother   . Hypertension Maternal Grandmother   . Varicose Veins Maternal Grandmother  Review of Systems  Constitutional: Negative for chills and fever.  Respiratory: Negative for cough and shortness of breath.   Cardiovascular: Negative for chest pain, palpitations and leg swelling.  Gastrointestinal: Negative for abdominal pain, nausea and vomiting.  Neurological: Positive for headaches.  All other systems reviewed and are negative.    OBJECTIVE:  Blood pressure 120/78, pulse 67, temperature 98.1 F (36.7 C), temperature source Oral, resp. rate 20, height 5' 1.3" (1.557 m), weight 158 lb (71.7 kg), SpO2 99 %, currently breastfeeding. Body mass index is 29.56 kg/m.    Visual Acuity Screening   Right eye Left eye Both eyes  Without correction:     With correction: 20/20 20/20 20/15     Physical Exam  Constitutional: She is oriented to person, place, and  time. She appears well-developed and well-nourished.  HENT:  Head: Normocephalic and atraumatic.  Right Ear: Hearing, tympanic membrane, external ear and ear canal normal.  Left Ear: Hearing, tympanic membrane, external ear and ear canal normal.  Mouth/Throat: Oropharynx is clear and moist.  Eyes: Pupils are equal, round, and reactive to light. Conjunctivae and EOM are normal.  Neck: Neck supple. No thyromegaly present.  Cardiovascular: Normal rate, regular rhythm, normal heart sounds and intact distal pulses. Exam reveals no gallop and no friction rub.  No murmur heard. Pulmonary/Chest: Effort normal and breath sounds normal. She has no wheezes. She has no rales.  Abdominal: Soft. Bowel sounds are normal. She exhibits no distension and no mass. There is no tenderness.  Musculoskeletal: Normal range of motion. She exhibits no edema.  Lymphadenopathy:    She has no cervical adenopathy.  Neurological: She is alert and oriented to person, place, and time. She has normal reflexes. No cranial nerve deficit. Gait normal.  Skin: Skin is warm and dry.  Psychiatric: She has a normal mood and affect.  Nursing note and vitals reviewed.   ASSESSMENT and PLAN  1. Annual physical exam No concerns per history or exam. HCM reviewed/discussed. Anticipatory guidance regarding healthy weight, lifestyle and choices given.  Discussed HPV vaccines, patient to return for nurse visit if she desires to start series  2. Chronic nonintractable headache, unspecified headache type - AMB referral to headache clinic  Return in about 1 year (around 02/11/2019) for CPE.    Myles LippsIrma M Santiago, MD [facility] [address] [place], [place] [address] Ph.  [phone] Fax [phone]

## 2018-02-10 NOTE — Patient Instructions (Addendum)
Preventive Care 18-39 Years, Female Preventive care refers to lifestyle choices and visits with your health care provider that can promote health and wellness. What does preventive care include?  A yearly physical exam. This is also called an annual well check.  Dental exams once or twice a year.  Routine eye exams. Ask your health care provider how often you should have your eyes checked.  Personal lifestyle choices, including: ? Daily care of your teeth and gums. ? Regular physical activity. ? Eating a healthy diet. ? Avoiding tobacco and drug use. ? Limiting alcohol use. ? Practicing safe sex. ? Taking vitamin and mineral supplements as recommended by your health care provider. What happens during an annual well check? The services and screenings done by your health care provider during your annual well check will depend on your age, overall health, lifestyle risk factors, and family history of disease. Counseling Your health care provider may ask you questions about your:  Alcohol use.  Tobacco use.  Drug use.  Emotional well-being.  Home and relationship well-being.  Sexual activity.  Eating habits.  Work and work Statistician.  Method of birth control.  Menstrual cycle.  Pregnancy history.  Screening You may have the following tests or measurements:  Height, weight, and BMI.  Diabetes screening. This is done by checking your blood sugar (glucose) after you have not eaten for a while (fasting).  Blood pressure.  Lipid and cholesterol levels. These may be checked every 5 years starting at age 66.  Skin check.  Hepatitis C blood test.  Hepatitis B blood test.  Sexually transmitted disease (STD) testing.  BRCA-related cancer screening. This may be done if you have a family history of breast, ovarian, tubal, or peritoneal cancers.  Pelvic exam and Pap test. This may be done every 3 years starting at age 40. Starting at age 59, this may be done every 5  years if you have a Pap test in combination with an HPV test.  Discuss your test results, treatment options, and if necessary, the need for more tests with your health care provider. Vaccines Your health care provider may recommend certain vaccines, such as:  Influenza vaccine. This is recommended every year.  Tetanus, diphtheria, and acellular pertussis (Tdap, Td) vaccine. You may need a Td booster every 10 years.  Varicella vaccine. You may need this if you have not been vaccinated.  HPV vaccine. If you are 69 or younger, you may need three doses over 6 months.  Measles, mumps, and rubella (MMR) vaccine. You may need at least one dose of MMR. You may also need a second dose.  Pneumococcal 13-valent conjugate (PCV13) vaccine. You may need this if you have certain conditions and were not previously vaccinated.  Pneumococcal polysaccharide (PPSV23) vaccine. You may need one or two doses if you smoke cigarettes or if you have certain conditions.  Meningococcal vaccine. One dose is recommended if you are age 27-21 years and a first-year college student living in a residence hall, or if you have one of several medical conditions. You may also need additional booster doses.  Hepatitis A vaccine. You may need this if you have certain conditions or if you travel or work in places where you may be exposed to hepatitis A.  Hepatitis B vaccine. You may need this if you have certain conditions or if you travel or work in places where you may be exposed to hepatitis B.  Haemophilus influenzae type b (Hib) vaccine. You may need this if  if you have certain risk factors.  Talk to your health care provider about which screenings and vaccines you need and how often you need them. This information is not intended to replace advice given to you by your health care provider. Make sure you discuss any questions you have with your health care provider. Document Released: 04/17/2001 Document Revised:  11/09/2015 Document Reviewed: 12/21/2014 Elsevier Interactive Patient Education  2018 Elsevier Inc.    If you have lab work done today you will be contacted with your lab results within the next 2 weeks.  If you have not heard from us then please contact us. The fastest way to get your results is to register for My Chart.   IF you received an x-ray today, you will receive an invoice from [facility]. Please contact [facility] at [phone] with questions or concerns regarding your invoice.   IF you received labwork today, you will receive an invoice from [facility]. Please contact [facility] at [phone] with questions or concerns regarding your invoice.   Our billing staff will not be able to assist you with questions regarding bills from these companies.  You will be contacted with the lab results as soon as they are available. The fastest way to get your results is to activate your My Chart account. Instructions are located on the last page of this paperwork. If you have not heard from us regarding the results in 2 weeks, please contact this office.     

## 2018-02-14 DIAGNOSIS — Z049 Encounter for examination and observation for unspecified reason: Secondary | ICD-10-CM | POA: Diagnosis not present

## 2018-02-14 DIAGNOSIS — G43709 Chronic migraine without aura, not intractable, without status migrainosus: Secondary | ICD-10-CM | POA: Diagnosis not present

## 2018-02-14 DIAGNOSIS — G43829 Menstrual migraine, not intractable, without status migrainosus: Secondary | ICD-10-CM | POA: Diagnosis not present

## 2018-03-03 ENCOUNTER — Telehealth: Payer: Self-pay | Admitting: Family Medicine

## 2018-03-03 NOTE — Telephone Encounter (Signed)
Per [employer], called pt to try and schedule her an appt. Pt stated that she will call her other Dr.'s office and go there.

## 2018-03-17 DIAGNOSIS — G43829 Menstrual migraine, not intractable, without status migrainosus: Secondary | ICD-10-CM | POA: Diagnosis not present

## 2018-03-17 DIAGNOSIS — G43709 Chronic migraine without aura, not intractable, without status migrainosus: Secondary | ICD-10-CM | POA: Diagnosis not present

## 2018-04-21 ENCOUNTER — Ambulatory Visit (INDEPENDENT_AMBULATORY_CARE_PROVIDER_SITE_OTHER): Payer: Federal, State, Local not specified - PPO

## 2018-04-21 DIAGNOSIS — Z3042 Encounter for surveillance of injectable contraceptive: Secondary | ICD-10-CM

## 2018-04-21 DIAGNOSIS — Z3202 Encounter for pregnancy test, result negative: Secondary | ICD-10-CM | POA: Diagnosis not present

## 2018-04-21 LAB — POCT URINE PREGNANCY: PREG TEST UR: NEGATIVE

## 2018-04-21 NOTE — Progress Notes (Signed)
Pt here for restart depo. Pt UPT is negative, pt instructed to come back in two weeks for a upt- as long as it negative she can receive her depo at that visit. Pt instructed to remain abstinent for 2 weeks, pt verbalized understanding.

## 2018-04-30 ENCOUNTER — Telehealth: Payer: Self-pay | Admitting: Family Medicine

## 2018-04-30 NOTE — Telephone Encounter (Signed)
Copied from CRM [id number]. Topic: General - Other >> Apr 30, 2018  4:08 PM Arlyss Gandy, NT wrote: Reason for CRM: Pt has been experiencing depression and mood swings that has made her miss work and she is wanting a note to be out of work. She tried getting an appt but could not schedule until near the end of March. She can come in sooner if possible. She states she had a baby last summer and she is having issues being away from her baby to long nad thinks possibly it could be a separation anxiety. Her OB advised to see her PCP for this. Please advise.

## 2018-05-05 ENCOUNTER — Ambulatory Visit: Payer: Federal, State, Local not specified - PPO | Admitting: Family Medicine

## 2018-05-05 ENCOUNTER — Ambulatory Visit: Payer: Federal, State, Local not specified - PPO

## 2018-05-05 ENCOUNTER — Encounter: Payer: Self-pay | Admitting: Family Medicine

## 2018-05-05 ENCOUNTER — Other Ambulatory Visit: Payer: Self-pay

## 2018-05-05 VITALS — BP 117/68 | HR 80 | Temp 98.4°F | Ht 61.3 in | Wt 162.8 lb

## 2018-05-05 DIAGNOSIS — O99345 Other mental disorders complicating the puerperium: Secondary | ICD-10-CM

## 2018-05-05 DIAGNOSIS — F53 Postpartum depression: Secondary | ICD-10-CM | POA: Diagnosis not present

## 2018-05-05 MED ORDER — SERTRALINE HCL 50 MG PO TABS: 50.0000 mg | ORAL_TABLET | Freq: Every day | ORAL | 3 refills | Status: DC

## 2018-05-05 NOTE — Progress Notes (Signed)
anx 

## 2018-05-05 NOTE — Progress Notes (Signed)
3/2/20208:53 AM  Alexandra Hart May 10, 1990, 28 y.o. female [id number]  Chief Complaint  Patient presents with  . Anxiety  . Depression    HPI:   Patient is a 28 y.o. female who presents today for concerns regarding anxiety and depression  Last OV in dec 2019, no issues then New mom, has an 8 month, breast feeding and solids She has no idea what has triggered recent feelings but of recent having spiraling out of control No issues with depression prior  Daughter is starting to sleep thru the night, but mom is unable to sleep as she spends most time making sure her baby is ok She is busy worrying Lack of sleep triggers migraines Many days she does not want to get out of bed Denies any SI or HI Her main support system is FOB Patient's mother can be intense and not helpful at times Works at the [employer], but she not been going due to mood Her last full day of work was the 15th of feb, she has not worked since then, she has been called in by HR due to absences  She has been trying to get seen since feb 12th We need to do FMLA paperwork She is starting depo provera thru gyn, she has been on depo before wo any mood side effects phq9 and gad 7 noted  Fall Risk  05/05/2018 02/10/2018 02/10/2018 01/29/2017  Falls in the past year? 0 0 0 No  Number falls in past yr: 0 - - -  Injury with Fall? 0 - - -     Depression screen [facility] 2/9 05/05/2018 02/10/2018 02/10/2018  Decreased Interest 2 0 0  Down, Depressed, Hopeless 3 0 0  PHQ - 2 Score 5 0 0  Altered sleeping 3 - -  Tired, decreased energy 3 - -  Change in appetite 3 - -  Feeling bad or failure about yourself  1 - -  Trouble concentrating 2 - -  Moving slowly or fidgety/restless 2 - -  Suicidal thoughts 0 - -  PHQ-9 Score 19 - -  Difficult doing work/chores Extremely dIfficult - -   GAD 7 : Generalized Anxiety Score 05/05/2018  Nervous, Anxious, on Edge 3  Control/stop worrying 2  Worry too much - different things 1  Trouble  relaxing 2  Restless 1  Easily annoyed or irritable 3  Afraid - awful might happen 0  Total GAD 7 Score 12  Anxiety Difficulty Extremely difficult     Allergies  Allergen Reactions  . Shrimp [Shellfish Allergy] Nausea And Vomiting  . Tomato Itching and Rash    Prior to Admission medications   Medication Sig Start Date End Date Taking? Authorizing Provider  baclofen (LIORESAL) 10 MG tablet TAKE 1 TABLET BY MOUTH TWICE DAILY AS NEEDED ( LIMIT 1 2 DAYS PER WEEK) 03/24/18   [provider]    Past Medical History:  Diagnosis Date  . Headache   . Supervision of normal first pregnancy, antepartum 12/27/2016    Clinic  [facility] Prenatal Labs Dating   LMP Blood type: O/Positive/-- (11/19 1007) O pos Genetic Screen 1 Screen: nl NT    AFP:  Negative  Antibody:Negative (11/19 1007)Neg Anatomic [place] normal Rubella: 4.92 (11/19 1007)Immune GTT Early:               Third trimester: nl 2 hour RPR: Non Reactive (03/11 1115) NR Flu vaccine 01/21/17 HBsAg: Negative (11/19 1007) Neg TDaP vaccine  05/13/17               .  Vaginal Pap smear, abnormal     Past Surgical History:  Procedure Laterality Date  . NO PAST SURGERIES      Social History   Tobacco Use  . Smoking status: Former Smoker    Packs/day: 0.50    Types: Cigarettes    Last attempt to quit: 11/27/2016    Years since quitting: 1.4  . Smokeless tobacco: Never Used  Substance Use Topics  . Alcohol use: Yes    Frequency: Never    Comment: occ    Family History  Problem Relation Age of Onset  . Varicose Veins Mother   . Miscarriages / Stillbirths Sister   . Asthma Maternal Grandmother   . Diabetes Maternal Grandmother   . Hypertension Maternal Grandmother   . Varicose Veins Maternal Grandmother     ROS Per hpi  OBJECTIVE:  Blood pressure 117/68, pulse 80, temperature 98.4 F (36.9 C), temperature source Oral, height 5' 1.3" (1.557 m), weight 162 lb 12.8 oz (73.8 kg), SpO2 97 %, currently breastfeeding. Body  mass index is 30.46 kg/m.   Physical Exam Vitals signs and nursing note reviewed.  Constitutional:      Appearance: She is well-developed.  HENT:     Head: Normocephalic and atraumatic.  Eyes:     General: No scleral icterus.    Conjunctiva/sclera: Conjunctivae normal.     Pupils: Pupils are equal, round, and reactive to light.  Neck:     Musculoskeletal: Neck supple.  Pulmonary:     Effort: Pulmonary effort is normal.  Skin:    General: Skin is warm and dry.  Neurological:     Mental Status: She is alert and oriented to person, place, and time.      ASSESSMENT and PLAN  1. Post partum depression New diagnosis. Moderate to severe. Denies SI or HI. Referring to counseling. Starting medication sertraline ok in breastfeeding, lactmed reviewed. Reviewed r/se/b of medication. Discussed starting at 1/2 tab daily x 1 week, then increase to 1 tab daily. Letter given for work. Ok with FMLA.  - Ambulatory referral to Psychology  Other orders - sertraline (ZOLOFT) 50 MG tablet; Take 1 tablet (50 mg total) by mouth daily.  Return in about 2 weeks (around 05/19/2018).    Myles Lipps, MD Primary Care at [facility] [address] [place], [place] [address] Ph.  [phone] Fax [phone]

## 2018-05-05 NOTE — Patient Instructions (Signed)
° ° ° °  If you have lab work done today you will be contacted with your lab results within the next 2 weeks.  If you have not heard from us then please contact us. The fastest way to get your results is to register for My Chart. ° ° °IF you received an x-ray today, you will receive an invoice from [facility]. Please contact [facility] at [phone] with questions or concerns regarding your invoice.  ° °IF you received labwork today, you will receive an invoice from [facility]. Please contact [facility] at [phone] with questions or concerns regarding your invoice.  ° °Our billing staff will not be able to assist you with questions regarding bills from these companies. ° °You will be contacted with the lab results as soon as they are available. The fastest way to get your results is to activate your My Chart account. Instructions are located on the last page of this paperwork. If you have not heard from us regarding the results in 2 weeks, please contact this office. °  ° ° ° °

## 2018-05-06 NOTE — Telephone Encounter (Signed)
Pt was seen in the office 05/05/2018 and evaluated for depression.

## 2018-05-08 ENCOUNTER — Ambulatory Visit (INDEPENDENT_AMBULATORY_CARE_PROVIDER_SITE_OTHER): Payer: Federal, State, Local not specified - PPO

## 2018-05-08 DIAGNOSIS — Z3042 Encounter for surveillance of injectable contraceptive: Secondary | ICD-10-CM

## 2018-05-08 DIAGNOSIS — Z3202 Encounter for pregnancy test, result negative: Secondary | ICD-10-CM | POA: Diagnosis not present

## 2018-05-08 DIAGNOSIS — Z30013 Encounter for initial prescription of injectable contraceptive: Secondary | ICD-10-CM

## 2018-05-08 LAB — POCT URINE PREGNANCY: Preg Test, Ur: NEGATIVE

## 2018-05-08 MED ORDER — MEDROXYPROGESTERONE ACETATE 150 MG/ML IM SUSP
150.0000 mg | INTRAMUSCULAR | Status: DC
  Administered 2018-05-08 – 2018-07-29 (×2): 150 mg via INTRAMUSCULAR

## 2018-05-08 NOTE — Progress Notes (Signed)
I have reviewed the chart and agree with nursing staff's documentation of this patient's encounter.  Jaynie Collins, MD 05/08/2018 1:09 PM

## 2018-05-08 NOTE — Progress Notes (Signed)
Pt is in the office for depo, administered in LUOQ and pt tolerated well. Next depo due 5/21-6/4 .Marland Kitchen Administrations This Visit    medroxyPROGESTERone (DEPO-PROVERA) injection 150 mg    Admin Date 05/08/2018 Action Given Dose 150 mg Route Intramuscular Administered By Katrina Stack, RN

## 2018-05-19 ENCOUNTER — Encounter: Payer: Self-pay | Admitting: Family Medicine

## 2018-05-19 ENCOUNTER — Ambulatory Visit: Payer: Federal, State, Local not specified - PPO | Admitting: Family Medicine

## 2018-05-19 ENCOUNTER — Other Ambulatory Visit: Payer: Self-pay

## 2018-05-19 VITALS — BP 122/80 | HR 78 | Temp 97.7°F | Resp 16 | Ht 61.3 in | Wt 161.8 lb

## 2018-05-19 DIAGNOSIS — O99345 Other mental disorders complicating the puerperium: Secondary | ICD-10-CM

## 2018-05-19 DIAGNOSIS — F53 Postpartum depression: Secondary | ICD-10-CM | POA: Diagnosis not present

## 2018-05-19 DIAGNOSIS — F411 Generalized anxiety disorder: Secondary | ICD-10-CM | POA: Diagnosis not present

## 2018-05-19 DIAGNOSIS — Z8659 Personal history of other mental and behavioral disorders: Secondary | ICD-10-CM | POA: Insufficient documentation

## 2018-05-19 HISTORY — DX: Generalized anxiety disorder: F41.1

## 2018-05-19 HISTORY — DX: Postpartum depression: F53.0

## 2018-05-19 MED ORDER — SERTRALINE HCL 100 MG PO TABS: 100.0000 mg | ORAL_TABLET | Freq: Every day | ORAL | 2 refills | Status: DC

## 2018-05-19 NOTE — Patient Instructions (Signed)
Continue with 50mg  for another week, then increase to 100mg  a day

## 2018-05-19 NOTE — Progress Notes (Signed)
3/16/202011:33 AM  Alexandra Hart 1991/03/01, 28 y.o. female [id number]  Chief Complaint  Patient presents with  . anxiety and depression    recheck- depression screen score: 13, anxiety screen score: 11.  Per pt started on 1/2 zoloft to start and then told to start 1 pill the next week but per pt she has not seen any change from halt a pill to a whole pill.    HPI:   Patient is a 28 y.o. 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 0 0 0 0 No  Number falls in past yr: 0 0 - - -  Injury with Fall? 0 0 - - -  Follow up Falls evaluation completed - - - -     Depression screen [facility] 2/9 05/19/2018 05/05/2018 05/05/2018  Decreased Interest 1 2 2   Down, Depressed, Hopeless 1 3 3   PHQ - 2 Score 2 5 5   Altered sleeping 2 3 3   Tired, decreased energy 3 3 3   Change in appetite 3 3 3   Feeling bad or failure about yourself  2 1 1   Trouble concentrating 1 2 2   Moving slowly or fidgety/restless 0 2 2  Suicidal thoughts 0 0 0  PHQ-9 Score 13 19 19   Difficult doing work/chores Extremely dIfficult Extremely dIfficult Extremely dIfficult   GAD 7 : Generalized Anxiety Score 05/19/2018 05/05/2018 05/05/2018  Nervous, Anxious, on Edge 2 3 3   Control/stop worrying 1 2 2   Worry too much - different things 1 1 1   Trouble relaxing 1 2 2   Restless 0 1 1  Easily annoyed or irritable 3 3 3   Afraid - awful might happen 3 0 0  Total GAD 7 Score 11 12 12   Anxiety Difficulty Extremely difficult - Extremely difficult     Allergies  Allergen Reactions  . Shrimp [Shellfish  Allergy] Nausea And Vomiting  . Tomato Itching and Rash    Prior to Admission medications   Medication Sig Start Date End Date Taking? Authorizing Provider  sertraline (ZOLOFT) 50 MG tablet Take 1 tablet (50 mg total) by mouth daily. 05/05/18  Yes Myles Lipps, MD    Past Medical History:  Diagnosis Date  . Headache   . Supervision of normal first pregnancy, antepartum 12/27/2016    Clinic  [facility] Prenatal Labs Dating   LMP Blood type: O/Positive/-- (11/19 1007) O pos Genetic Screen 1 Screen: nl NT    AFP:  Negative  Antibody:Negative (11/19 1007)Neg Anatomic [place] normal Rubella: 4.92 (11/19 1007)Immune GTT Early:               Third trimester: nl 2 hour RPR: Non Reactive (03/11 1115) NR Flu vaccine 01/21/17 HBsAg: Negative (11/19 1007) Neg TDaP vaccine  05/13/17               . Vaginal Pap smear, abnormal     Past Surgical History:  Procedure Laterality Date  . NO PAST SURGERIES      Social History   Tobacco Use  . Smoking status: Former Smoker    Packs/day:  0.50    Types: Cigarettes    Last attempt to quit: 11/27/2016    Years since quitting: 1.4  . Smokeless tobacco: Never Used  Substance Use Topics  . Alcohol use: Yes    Frequency: Never    Comment: occ    Family History  Problem Relation Age of Onset  . Varicose Veins Mother   . Miscarriages / Stillbirths Sister   . Asthma Maternal Grandmother   . Diabetes Maternal Grandmother   . Hypertension Maternal Grandmother   . Varicose Veins Maternal Grandmother     ROS Per hpi  OBJECTIVE:  Blood pressure 122/80, pulse 78, temperature 97.7 F (36.5 C), temperature source Oral, resp. rate 16, height 5' 1.3" (1.557 m), weight 161 lb 12.8 oz (73.4 kg), last menstrual period 08/09/2017, SpO2 97 %, currently breastfeeding. Body mass index is 30.27 kg/m.   Physical Exam Vitals signs and nursing note reviewed.  Constitutional:      Appearance: She is well-developed.  HENT:     Head: Normocephalic and atraumatic.   Eyes:     General: No scleral icterus.    Conjunctiva/sclera: Conjunctivae normal.     Pupils: Pupils are equal, round, and reactive to light.  Neck:     Musculoskeletal: Neck supple.  Pulmonary:     Effort: Pulmonary effort is normal.  Skin:    General: Skin is warm and dry.  Neurological:     Mental Status: She is alert and oriented to person, place, and time.      ASSESSMENT and PLAN  1. Post partum depression 2. Generalized anxiety disorder Increasing sertraline to 100mg  once a day. Return to work letter given. Requesting change to day shift as sleep deprivation will only worsen her depression  Other orders - sertraline (ZOLOFT) 100 MG tablet; Take 1 tablet (100 mg total) by mouth daily.  Return in about 4 weeks (around 06/16/2018).    Myles Lipps, MD Primary Care at [facility] [address] [place], [place] [address] Ph.  [phone] Fax [phone]

## 2018-05-28 ENCOUNTER — Ambulatory Visit: Payer: Federal, State, Local not specified - PPO | Admitting: Family Medicine

## 2018-06-26 ENCOUNTER — Telehealth: Payer: Self-pay | Admitting: Family Medicine

## 2018-06-26 NOTE — Telephone Encounter (Signed)
06/26/2018 - PATIENT HAD AN APPOINTMENT SCHEDULED WITH DR. Darcel Bayley ON Saturday (06/28/2018) FOR A FOLLOW-UP ON HER MEDICAL CONDITIONS. THIS APPOINTMENT WAS MADE BEFORE WE STARTED TO CLOSE ON SATURDAYS DUE TO THE COVID 19 VIRUS. I TRIED TO CALL HER TO RESCHEDULE BUT I HAD TO LEAVE HER A VOICE MAIL TO RETURN OUR CALL. MBC

## 2018-06-28 ENCOUNTER — Ambulatory Visit: Payer: Federal, State, Local not specified - PPO | Admitting: Family Medicine

## 2018-07-15 ENCOUNTER — Encounter: Payer: Self-pay | Admitting: Family Medicine

## 2018-07-29 ENCOUNTER — Other Ambulatory Visit: Payer: Self-pay

## 2018-07-29 ENCOUNTER — Ambulatory Visit (INDEPENDENT_AMBULATORY_CARE_PROVIDER_SITE_OTHER): Payer: Federal, State, Local not specified - PPO

## 2018-07-29 ENCOUNTER — Telehealth: Payer: Self-pay | Admitting: Family Medicine

## 2018-07-29 DIAGNOSIS — Z3042 Encounter for surveillance of injectable contraceptive: Secondary | ICD-10-CM | POA: Diagnosis not present

## 2018-07-29 NOTE — Progress Notes (Signed)
Nurse visit for Depo Pt is on time for inj. Depo given LUOQ Next Depo due Aug 11-25, pt agrees.

## 2018-07-29 NOTE — Progress Notes (Signed)
I have reviewed the chart and agree with nursing staff's documentation of this patient's encounter.  Catalina Antigua, MD 07/29/2018 4:54 PM

## 2018-07-29 NOTE — Telephone Encounter (Signed)
Per [employer] message patient needs a work excuse

## 2018-07-30 NOTE — Telephone Encounter (Signed)
Called and informed pt that she would need to come into the office for a work note. Due to this matter being back in February there can not be a note provided at this time with out an OV. Pt was provided a work note in March from dates 3/11-3/18 and was advised to follow-up for further evaluation.

## 2018-08-14 ENCOUNTER — Ambulatory Visit: Payer: Federal, State, Local not specified - PPO | Admitting: Family Medicine

## 2018-08-14 ENCOUNTER — Encounter: Payer: Self-pay | Admitting: Family Medicine

## 2018-08-14 ENCOUNTER — Other Ambulatory Visit: Payer: Self-pay

## 2018-08-14 VITALS — BP 122/84 | HR 82 | Temp 98.9°F | Ht 61.3 in | Wt 165.0 lb

## 2018-08-14 DIAGNOSIS — F53 Postpartum depression: Secondary | ICD-10-CM

## 2018-08-14 DIAGNOSIS — F411 Generalized anxiety disorder: Secondary | ICD-10-CM

## 2018-08-14 DIAGNOSIS — Z560 Unemployment, unspecified: Secondary | ICD-10-CM | POA: Diagnosis not present

## 2018-08-14 DIAGNOSIS — O99345 Other mental disorders complicating the puerperium: Secondary | ICD-10-CM

## 2018-08-14 MED ORDER — ESCITALOPRAM OXALATE 20 MG PO TABS: 20.0000 mg | ORAL_TABLET | Freq: Every day | ORAL | 3 refills | Status: DC

## 2018-08-14 NOTE — Patient Instructions (Signed)
° ° ° °  If you have lab work done today you will be contacted with your lab results within the next 2 weeks.  If you have not heard from us then please contact us. The fastest way to get your results is to register for My Chart. ° ° °IF you received an x-ray today, you will receive an invoice from [facility]. Please contact [facility] at [phone] with questions or concerns regarding your invoice.  ° °IF you received labwork today, you will receive an invoice from [facility]. Please contact [facility] at [phone] with questions or concerns regarding your invoice.  ° °Our billing staff will not be able to assist you with questions regarding bills from these companies. ° °You will be contacted with the lab results as soon as they are available. The fastest way to get your results is to activate your My Chart account. Instructions are located on the last page of this paperwork. If you have not heard from us regarding the results in 2 weeks, please contact this office. °  ° ° ° °

## 2018-08-14 NOTE — Progress Notes (Signed)
6/11/20203:10 PM  Kelsay Haggard December 27, 1990, 28 y.o., female [id number]  Chief Complaint  Patient presents with  . Anxiety    says the medication seems to be working fine just gives her diarrhea. No longer working, received removal letter 07/23/2018    HPI:   Patient is a 28 y.o. female with past medical history significant for postpartum depression who presents today for followup  Last OV march 2020 Increased sertraline to 100mg  Causing diarrhea, had one accident - feels this has been traumatizing and was afraid of going to work while taking the medication  Getting out of bed, brushing teeth, then to couch, not doing much more. She does feel calmer. Continues to breastfeeding, she has not noticed any changes in her child since patient started medication Has very good support from FOB She is unable to afford counseling thru [facility]   Patient has been furloughed from work due to absentee from April 1- Jul 16, 2018 Worked as Development worker, community carrier Unfortunately she never replied to any of their letters or attended interviews FMLA was not approved due to she not having been at work for long enough time Last OV in March I said ok to return to work with daytime shift, followup in 4 weeks, this appt was cancelled as it was a Saturday She however never went back to work. She did not feel ready/able Also her daughter started sleeping during the day and being up at night   Fall Risk  08/14/2018 05/19/2018 05/05/2018 02/10/2018 02/10/2018  Falls in the past year? 1 0 0 0 0  Number falls in past yr: 0 0 0 - -  Injury with Fall? 0 0 0 - -  Follow up - Falls evaluation completed - - -     Depression screen [facility] 2/9 08/14/2018 08/14/2018 05/19/2018  Decreased Interest 3 3 1   Down, Depressed, Hopeless 1 1 1   PHQ - 2 Score 4 4 2   Altered sleeping 1 1 2   Tired, decreased energy 2 2 3   Change in appetite 0 0 3  Feeling bad or failure about yourself  2 2 2   Trouble concentrating 1 1 1   Moving slowly or  fidgety/restless 1 1 0  Suicidal thoughts 0 0 0  PHQ-9 Score 11 11 13   Difficult doing work/chores - - Extremely dIfficult   GAD 7 : Generalized Anxiety Score 08/14/2018 08/14/2018 05/19/2018 05/05/2018  Nervous, Anxious, on Edge 1 1 2 3   Control/stop worrying 2 2 1 2   Worry too much - different things 2 2 1 1   Trouble relaxing 2 2 1 2   Restless 1 1 0 1  Easily annoyed or irritable 2 2 3 3   Afraid - awful might happen 1 1 3  0  Total GAD 7 Score 11 11 11 12   Anxiety Difficulty - - Extremely difficult -     Allergies  Allergen Reactions  . Shrimp [Shellfish Allergy] Nausea And Vomiting  . Tomato Itching and Rash    Prior to Admission medications   Medication Sig Start Date End Date Taking? Authorizing Provider  sertraline (ZOLOFT) 100 MG tablet Take 1 tablet (100 mg total) by mouth daily. 05/19/18  Yes Rutherford Guys, MD    Past Medical History:  Diagnosis Date  . Generalized anxiety disorder 05/19/2018  . Headache   . Post partum depression 05/19/2018  . Supervision of normal first pregnancy, antepartum 12/27/2016    Clinic  [facility] Prenatal Labs Dating   LMP Blood type: O/Positive/-- (11/19 1007) O pos  Genetic Screen 1 Screen: nl NT    AFP:  Negative  Antibody:Negative (11/19 1007)Neg Anatomic US normal Rubella: 4.92 (11/19 1007)Immune GTT Early:               Third trimester: nl 2 hour RPR: Non Reactive (03/11 1115) NR Flu vaccine 01/21/17 HBsAg: Negative (11/19 1007) Neg TDaP vaccine  05/13/17               . Vaginal Pap smear, abnormal     Past Surgical History:  Procedure Laterality Date  . NO PAST SURGERIES      Social History   Tobacco Use  . Smoking status: Former Smoker    Packs/day: 0.50    Types: Cigarettes    Quit date: 11/27/2016    Years since quitting: 1.7  . Smokeless tobacco: Never Used  Substance Use Topics  . Alcohol use: Yes    Frequency: Never    Comment: occ    Family History  Problem Relation Age of Onset  . Varicose Veins Mother   .  Miscarriages / Stillbirths Sister   . Asthma Maternal Grandmother   . Diabetes Maternal Grandmother   . Hypertension Maternal Grandmother   . Varicose Veins Maternal Grandmother     ROS Per hpi  OBJECTIVE:  Today's Vitals   08/14/18 1505  BP: 122/84  Pulse: 82  Temp: 98.9 F (37.2 C)  TempSrc: Oral  SpO2: 100%  Weight: 165 lb (74.8 kg)  Height: 5' 1.3" (1.557 m)   Body mass index is 30.87 kg/m.   Physical Exam Vitals signs and nursing note reviewed.  Constitutional:      Appearance: She is well-developed.  HENT:     Head: Normocephalic and atraumatic.  Eyes:     General: No scleral icterus.    Conjunctiva/sclera: Conjunctivae normal.     Pupils: Pupils are equal, round, and reactive to light.  Neck:     Musculoskeletal: Neck supple.  Pulmonary:     Effort: Pulmonary effort is normal.  Skin:    General: Skin is warm and dry.  Neurological:     Mental Status: She is alert and oriented to person, place, and time.  Psychiatric:        Mood and Affect: Affect is flat.        Speech: Speech normal.     ASSESSMENT and PLAN  1. Post partum depression 2. Generalized anxiety disorder Not well controlled and having side effects to medication. Currently breastfeeding. Changing to lexapro. Consider psychiatry referral.  3. Out of work Discussed with patient that I will investigate what happened with April appt, however I would not be able to excuse her from work and her not responding to employer.  Other orders - escitalopram (LEXAPRO) 20 MG tablet; Take 1 tablet (20 mg total) by mouth at bedtime.  Return in about 4 weeks (around 09/11/2018).    Myles LippsIrma M Santiago, MD Primary [facility] at [facility] [address] [place], [place] [address] Ph.  [phone] Fax [phone]

## 2018-09-11 ENCOUNTER — Ambulatory Visit: Payer: Federal, State, Local not specified - PPO | Admitting: Family Medicine

## 2018-09-12 ENCOUNTER — Encounter: Payer: Self-pay | Admitting: Family Medicine

## 2018-10-20 ENCOUNTER — Ambulatory Visit: Payer: Federal, State, Local not specified - PPO

## 2018-10-21 ENCOUNTER — Telehealth: Payer: Self-pay | Admitting: Obstetrics

## 2019-02-17 ENCOUNTER — Encounter: Payer: Federal, State, Local not specified - PPO | Admitting: Family Medicine

## 2019-09-14 DIAGNOSIS — Z20822 Contact with and (suspected) exposure to covid-19: Secondary | ICD-10-CM | POA: Diagnosis not present

## 2020-08-11 ENCOUNTER — Ambulatory Visit (HOSPITAL_COMMUNITY)
Admission: EM | Admit: 2020-08-11 | Discharge: 2020-08-11 | Disposition: A | Discharge status: Home / Self Care | Payer: Self-pay | Attending: Family Medicine | Admitting: Family Medicine

## 2020-08-11 ENCOUNTER — Other Ambulatory Visit: Payer: Self-pay

## 2020-08-11 ENCOUNTER — Encounter (HOSPITAL_COMMUNITY): Payer: Self-pay

## 2020-08-11 DIAGNOSIS — R52 Pain, unspecified: Secondary | ICD-10-CM

## 2020-08-11 DIAGNOSIS — R509 Fever, unspecified: Secondary | ICD-10-CM

## 2020-08-11 MED ORDER — HYDROCOD POLST-CPM POLST ER 10-8 MG/5ML PO SUER: 5.0000 mL | Freq: Two times a day (BID) | ORAL | 0 refills | Status: DC | PRN

## 2020-08-11 MED ORDER — OSELTAMIVIR PHOSPHATE 75 MG PO CAPS: 75.0000 mg | ORAL_CAPSULE | Freq: Two times a day (BID) | ORAL | 0 refills | Status: AC

## 2020-08-11 MED ORDER — ACETAMINOPHEN 325 MG PO TABS
650.0000 mg | ORAL_TABLET | Freq: Once | ORAL | Status: AC
  Administered 2020-08-11: 650 mg via ORAL

## 2020-08-11 MED ORDER — ACETAMINOPHEN 325 MG PO TABS
ORAL_TABLET | ORAL | Status: AC
  Filled 2020-08-11: qty 2

## 2020-08-11 NOTE — ED Triage Notes (Signed)
Pt reports fever 103.0 F, cough  and body aches x 3 days. Theraflu gives no relief.

## 2020-08-11 NOTE — Discharge Instructions (Addendum)

## 2020-08-12 ENCOUNTER — Telehealth (HOSPITAL_COMMUNITY): Payer: Self-pay | Admitting: Internal Medicine

## 2020-08-12 MED ORDER — HYDROCOD POLST-CPM POLST ER 10-8 MG/5ML PO SUER: 5.0000 mL | Freq: Two times a day (BID) | ORAL | 0 refills | Status: DC | PRN

## 2020-08-12 NOTE — Telephone Encounter (Signed)
Tussionex resent to [facility] on [address], [place] prescription has been cancelled.

## 2020-08-16 NOTE — ED Provider Notes (Signed)
[facility]   [id number] 08/11/20 Arrival Time: 1845  ASSESSMENT & PLAN:  1. Fever, unspecified   2. Body aches    Suspect influenza.  OTC symptom care as needed.  Meds ordered this encounter  Medications   acetaminophen (TYLENOL) tablet 650 mg   chlorpheniramine-HYDROcodone (TUSSIONEX PENNKINETIC ER) 10-8 MG/5ML SUER    Sig: Take 5 mLs by mouth every 12 (twelve) hours as needed for cough.    Dispense:  90 mL    Refill:  0   oseltamivir (TAMIFLU) 75 MG capsule    Sig: Take 1 capsule (75 mg total) by mouth 2 (two) times daily for 5 days.    Dispense:  10 capsule    Refill:  0     Follow-up Information     Lezlie Lye, Meda Coffee, MD.   Specialty: Family Medicine Why: If worsening or failing to improve as anticipated. Contact information: [address] [place] [address] [phone]                 Reviewed expectations re: course of current medical issues. Questions answered. Outlined signs and symptoms indicating need for more acute intervention. Understanding verbalized. After Visit Summary given.   SUBJECTIVE: History from: patient. Alexandra Hart is a 30 y.o. female who reports fever, chills, body aches; abrupt onset; 1-2 d. Also with significant cough. Denies: difficulty breathing. Normal PO intake without n/v/d.    OBJECTIVE:  Vitals:   08/11/20 1924  BP: 127/80  Pulse: 99  Resp: 18  Temp: (!) 103.1 F (39.5 C)  TempSrc: Oral  SpO2: 100%    T noted. General appearance: alert; no distress Eyes: PERRLA; EOMI; conjunctiva normal HENT: [place]; AT; with nasal congestion Neck: supple  Lungs: speaks full sentences without difficulty; unlabored; CTAB; active cough Extremities: no edema Skin: warm and dry Neurologic: normal gait Psychological: alert and cooperative; normal mood and affect   Allergies  Allergen Reactions   Shrimp [Shellfish Allergy] Nausea And Vomiting   Tomato Itching and Rash    Past Medical  History:  Diagnosis Date   Generalized anxiety disorder 05/19/2018   Headache    Post partum depression 05/19/2018   Supervision of normal first pregnancy, antepartum 12/27/2016    Clinic  [facility] Prenatal Labs Dating   LMP Blood type: O/Positive/-- (11/19 1007) O pos Genetic Screen 1 Screen: nl NT    AFP:  Negative  Antibody:Negative (11/19 1007)Neg Anatomic [place] normal Rubella: 4.92 (11/19 1007)Immune GTT Early:               Third trimester: nl 2 hour RPR: Non Reactive (03/11 1115) NR Flu vaccine 01/21/17 HBsAg: Negative (11/19 1007) Neg TDaP vaccine  05/13/17                Vaginal Pap smear, abnormal    Social History   Socioeconomic History   Marital status: Single    Spouse name: Not on file   Number of children: 1   Years of education: Not on file   Highest education level: Not on file  Occupational History   Not on file  Tobacco Use   Smoking status: Former    Packs/day: 0.50    Pack years: 0.00    Types: Cigarettes    Quit date: 11/27/2016    Years since quitting: 3.7   Smokeless tobacco: Never  Vaping Use   Vaping Use: Never used  Substance and Sexual Activity   Alcohol use: Yes    Comment:  occ   Drug use: No   Sexual activity: Yes    Partners: Male    Birth control/protection: Injection  Other Topics Concern   Not on file  Social History Narrative   Not on file   Social Determinants of Health   Financial Resource Strain: Not on file  Food Insecurity: Not on file  Transportation Needs: Not on file  Physical Activity: Not on file  Stress: Not on file  Social Connections: Not on file  Intimate Partner Violence: Not on file   Family History  Problem Relation Age of Onset   Varicose Veins Mother    Miscarriages / Stillbirths Sister    Asthma Maternal Grandmother    Diabetes Maternal Grandmother    Hypertension Maternal Grandmother    Varicose Veins Maternal Grandmother    Past Surgical History:  Procedure Laterality Date   NO PAST SURGERIES        Mardella Layman, MD 08/16/20 [phone]

## 2020-08-23 DIAGNOSIS — Z20822 Contact with and (suspected) exposure to covid-19: Secondary | ICD-10-CM | POA: Diagnosis not present

## 2020-08-23 DIAGNOSIS — R059 Cough, unspecified: Secondary | ICD-10-CM | POA: Diagnosis not present

## 2020-08-23 DIAGNOSIS — Z03818 Encounter for observation for suspected exposure to other biological agents ruled out: Secondary | ICD-10-CM | POA: Diagnosis not present

## 2021-07-21 ENCOUNTER — Encounter: Payer: Self-pay | Admitting: Family

## 2021-07-21 ENCOUNTER — Ambulatory Visit: Payer: BC Managed Care – PPO | Admitting: Family

## 2021-07-21 VITALS — BP 119/73 | HR 73 | Temp 98.6°F | Ht 61.0 in | Wt 146.1 lb

## 2021-07-21 DIAGNOSIS — Z3491 Encounter for supervision of normal pregnancy, unspecified, first trimester: Secondary | ICD-10-CM | POA: Diagnosis not present

## 2021-07-21 LAB — POCT URINE PREGNANCY: Preg Test, Ur: POSITIVE — AB

## 2021-07-21 NOTE — Progress Notes (Signed)
New Patient Office Visit  Subjective:  Patient ID: Alexandra Hart, female    DOB: 05/26/1990  Age: 31 y.o. MRN: [id number]  CC:  Chief Complaint  Patient presents with   Establish Care   Amenorrhea    Pt c/o missed period, cramps. At home test were positive. [facility].    HPI Alexandra Hart presents for establishing care today.  Amenorrhea: Patient complains of amenorrhea. Currently periods are occurring every 8 weeks.   Bleeding is light.  Periods were regular in the past occurring every 4 weeks. Patient has no relevant history of abnormal sexual development. History of abnormal Pap smear: no - about 3 years ago. Is there a chance of pregnancy? yes  HCG? yes, positive. Last LMP about 06/02/2021.   Assessment & Plan:   Problem List Items Addressed This Visit   None Visit Diagnoses     First trimester pregnancy    -  Primary   LMP around 06/02/2021. Reports some nausea, cramping. Advised on pregnancy precautions, handout provided. Referring to OB.  Relevant Orders   POCT urine pregnancy (Completed)   Ambulatory referral to Obstetrics / Gynecology      Outpatient Medications Prior to Visit  Medication Sig Dispense Refill   chlorpheniramine-HYDROcodone (TUSSIONEX PENNKINETIC ER) 10-8 MG/5ML SUER Take 5 mLs by mouth every 12 (twelve) hours as needed for cough. 90 mL 0   diphenhydrAMINE-PE-APAP (THERAFLU EXPRESSMAX PO) Take by mouth.     escitalopram (LEXAPRO) 20 MG tablet Take 1 tablet (20 mg total) by mouth at bedtime. 30 tablet 3   Facility-Administered Medications Prior to Visit  Medication Dose Route Frequency Provider Last Rate Last Admin   medroxyPROGESTERone (DEPO-PROVERA) injection 150 mg  150 mg Intramuscular Q90 days Sharyon Cable, CNM   150 mg at 12/02/17 1159   medroxyPROGESTERone (DEPO-PROVERA) injection 150 mg  150 mg Intramuscular Q90 days Brock Bad, MD   150 mg at 07/29/18 1634    Past Medical  History:  Diagnosis Date   Generalized anxiety disorder 05/19/2018   Headache    Post partum depression 05/19/2018   Supervision of normal first pregnancy, antepartum 12/27/2016    Clinic  [facility] Prenatal Labs Dating   LMP Blood type: O/Positive/-- (11/19 1007) O pos Genetic Screen 1 Screen: nl NT    AFP:  Negative  Antibody:Negative (11/19 1007)Neg Anatomic [place] normal Rubella: 4.92 (11/19 1007)Immune GTT Early:               Third trimester: nl 2 hour RPR: Non Reactive (03/11 1115) NR Flu vaccine 01/21/17 HBsAg: Negative (11/19 1007) Neg TDaP vaccine  05/13/17                Vaginal Pap smear, abnormal     Past Surgical History:  Procedure Laterality Date   NO PAST SURGERIES      Objective:   Today's Vitals: BP 119/73 (BP Location: Left Arm, Patient Position: Sitting, Cuff Size: Large)   Pulse 73   Temp 98.6 F (37 C) (Temporal)   Ht 5\' 1"  (1.549 m)   Wt 146 lb 2 oz (66.3 kg)   LMP 08/10/2020 (Exact Date)   SpO2 100%   Breastfeeding No   BMI 27.61 kg/m   Physical Exam Vitals and nursing note reviewed.  Constitutional:      Appearance: Normal appearance.  Cardiovascular:     Rate and Rhythm: Normal rate and regular rhythm.  Pulmonary:     Effort: Pulmonary  effort is normal.     Breath sounds: Normal breath sounds.  Musculoskeletal:        General: Normal range of motion.  Skin:    General: Skin is warm and dry.  Neurological:     Mental Status: She is alert.  Psychiatric:        Mood and Affect: Mood normal.        Behavior: Behavior normal.     Follow-up: Return for any future concerns.   Dulce Sellar, NP

## 2021-07-21 NOTE — Patient Instructions (Addendum)
Welcome to [facility], It was a pleasure meeting you today!   I have sent a referral to the [facility] office at [facility]. They will call you directly.  Good luck with your pregnancy! See the attached handout to review information we may have not discussed.    PLEASE NOTE: If you had any LAB tests please let us know if you have not heard back within a few days. You may see your results on MyChart before we have a chance to review them but we will give you a call once they are reviewed by [place]. If we ordered any REFERRALS today, please let us know if you have not heard from their office within the next week.  Let us know through MyChart if you are needing REFILLS, or have your pharmacy send [place] the request. You can also use MyChart to communicate with me or any office staff.

## 2021-07-24 ENCOUNTER — Ambulatory Visit: Payer: BC Managed Care – PPO | Admitting: Physician Assistant

## 2021-08-16 ENCOUNTER — Ambulatory Visit (INDEPENDENT_AMBULATORY_CARE_PROVIDER_SITE_OTHER): Payer: BC Managed Care – PPO | Admitting: *Deleted

## 2021-08-16 ENCOUNTER — Ambulatory Visit (INDEPENDENT_AMBULATORY_CARE_PROVIDER_SITE_OTHER): Payer: BC Managed Care – PPO

## 2021-08-16 VITALS — BP 117/75 | HR 69 | Ht 61.0 in | Wt 144.6 lb

## 2021-08-16 DIAGNOSIS — Z3480 Encounter for supervision of other normal pregnancy, unspecified trimester: Secondary | ICD-10-CM

## 2021-08-16 DIAGNOSIS — Z3A11 11 weeks gestation of pregnancy: Secondary | ICD-10-CM | POA: Diagnosis not present

## 2021-08-16 DIAGNOSIS — O3680X0 Pregnancy with inconclusive fetal viability, not applicable or unspecified: Secondary | ICD-10-CM

## 2021-08-16 MED ORDER — BLOOD PRESSURE KIT DEVI: 1.0000 | 0 refills | Status: DC

## 2021-08-16 NOTE — Progress Notes (Cosign Needed)
New OB Intake  I connected with  Alexandra Hart on 08/16/21 at  3:10 PM EDT by In Person Visit and verified that I am speaking with the correct person using two identifiers. Nurse is located at [facility] and pt is located at [place].  I discussed the limitations, risks, security and privacy concerns of performing an evaluation and management service by telephone and the availability of in person appointments. I also discussed with the patient that there may be a patient responsible charge related to this service. The patient expressed understanding and agreed to proceed.  I explained I am completing New OB Intake today. We discussed her EDD of 03/09/21 that is based on LMP of 06/02/21. Pt is G2/P1. I reviewed her allergies, medications, Medical/Surgical/OB history, and appropriate screenings. I informed her of [facility] services. Based on history, this is a/an  pregnancy uncomplicated .   Patient Active Problem List   Diagnosis Date Noted   Post partum depression 05/19/2018   Generalized anxiety disorder 05/19/2018   SVD (spontaneous vaginal delivery) 08/09/2017    Concerns addressed today  Delivery Plans:  Plans to deliver at [facility].   MyChart/Babyscripts MyChart access verified. I explained pt will have some visits in office and some virtually. Babyscripts instructions given and order placed. Patient verifies receipt of registration text/e-mail. Account successfully created and app downloaded.  Blood Pressure Cuff  Patient has private insurance; instructed to purchase blood pressure cuff and bring to first prenatal appt. Explained after first prenatal appt pt will check weekly and document in Babyscripts.  Weight scale: Patient does / does not  have weight scale. Weight scale ordered for patient to pick up from [employer].   Anatomy US Explained first scheduled [place] will be around 19 weeks. Anatomy US scheduled for 19 weeks at MFM. Pt notified to arrive at [facility]. Scheduled AFP lab only  appointment if CenteringPregnancy pt for same day as anatomy US.   Labs Discussed Avelina Laine genetic screening with patient. Would like both Panorama and Horizon drawn at new OB visit.Also if interested in genetic testing, tell patient she will need AFP 15-21 weeks to complete genetic testing .Routine prenatal labs needed.  Covid Vaccine Patient has not covid vaccine.   Is patient a CenteringPregnancy candidate?  Declined Declined due to Schedule/Times Not a candidate due to NA Centering Patient" indicated on sticky note   Is patient a Mom+Baby Combined Care candidate?  Not a candidate   No Scheduled with Mom+Baby provider    Is patient interested in Waterbirth?  Yes  Yes "Interested in WB - Schedule next visit with CNM" on sticky note  Informed patient of [facility] website  and placed link in her AVS.   Social Determinants of Health Food Insecurity: Patient denies food insecurity. WIC Referral: Patient is interested in referral to [facility].  Transportation: Patient denies transportation needs. Childcare: Discussed no children allowed at ultrasound appointments. Offered childcare services; patient declines childcare services at this time.  Send link to Pregnancy Navigators   Placed OB Box on problem list and updated  First visit review I reviewed new OB appt with pt. I explained she will have a pelvic exam, ob bloodwork with genetic screening, and PAP smear. Explained pt will be seen by [facility] at first visit; encounter routed to appropriate provider. Explained that patient will be seen by pregnancy navigator following visit with provider. [facility] information placed in AVS.   Harrel Lemon, RN 08/16/2021  3:49 PM

## 2021-08-16 NOTE — Progress Notes (Signed)
Agree with nurses's documentation of this patient's clinic encounter.  Dontea Corlew L, MD  

## 2021-08-24 ENCOUNTER — Ambulatory Visit (INDEPENDENT_AMBULATORY_CARE_PROVIDER_SITE_OTHER): Payer: BC Managed Care – PPO | Admitting: Obstetrics and Gynecology

## 2021-08-24 ENCOUNTER — Institutional Professional Consult (permissible substitution): Payer: BC Managed Care – PPO | Admitting: Licensed Clinical Social Worker

## 2021-08-24 ENCOUNTER — Encounter: Payer: Self-pay | Admitting: Obstetrics and Gynecology

## 2021-08-24 ENCOUNTER — Other Ambulatory Visit (HOSPITAL_COMMUNITY)
Admission: RE | Admit: 2021-08-24 | Discharge: 2021-08-24 | Disposition: A | Discharge status: Home / Self Care | Payer: BC Managed Care – PPO | Source: Ambulatory Visit | Attending: Obstetrics and Gynecology | Admitting: Obstetrics and Gynecology

## 2021-08-24 VITALS — BP 110/63 | HR 72 | Wt 147.3 lb

## 2021-08-24 DIAGNOSIS — Z01419 Encounter for gynecological examination (general) (routine) without abnormal findings: Secondary | ICD-10-CM | POA: Insufficient documentation

## 2021-08-24 DIAGNOSIS — Z3A11 11 weeks gestation of pregnancy: Secondary | ICD-10-CM

## 2021-08-24 DIAGNOSIS — O98811 Other maternal infectious and parasitic diseases complicating pregnancy, first trimester: Secondary | ICD-10-CM | POA: Insufficient documentation

## 2021-08-24 DIAGNOSIS — Z3480 Encounter for supervision of other normal pregnancy, unspecified trimester: Secondary | ICD-10-CM

## 2021-08-24 DIAGNOSIS — F53 Postpartum depression: Secondary | ICD-10-CM

## 2021-08-24 NOTE — Progress Notes (Signed)
History:   Alexandra Hart is a 31 y.o. G2P1001 at [redacted]w[redacted]d by LMP being seen today for her first obstetrical visit.  Her obstetrical history is significant for  postpartum depression . Patient does intend to breast feed. Pregnancy history fully reviewed. Does not want to know gender until after 19w [place]. She will have genetics drawn after this. Partner is here and is supportive. Desired pregnancy.   Patient reports no complaints.  HISTORY: OB History  Gravida Para Term Preterm AB Living  2 1 1  0 0 1  SAB IAB Ectopic Multiple Live Births  0 0 0 1 1    # Outcome Date GA Lbr Len/2nd Weight Sex Delivery Anes PTL Lv  2A Gravida           2B Current           1 Term 08/09/17 [redacted]w[redacted]d 04:27 / 00:18 6 lb 11.6 oz (3.05 kg) F Vag-Spont None  LIV     Name: KEOLA, RABANALES     Apgar1: 8  Apgar5: 9    Last pap smear was done 2018 and was normal  Past Medical History:  Diagnosis Date   Generalized anxiety disorder 05/19/2018   Headache    Post partum depression 05/19/2018   Supervision of normal first pregnancy, antepartum 12/27/2016    Clinic  [facility] Prenatal Labs Dating   LMP Blood type: O/Positive/-- (11/19 1007) O pos Genetic Screen 1 Screen: nl NT    AFP:  Negative  Antibody:Negative (11/19 1007)Neg Anatomic [place] normal Rubella: 4.92 (11/19 1007)Immune GTT Early:               Third trimester: nl 2 hour RPR: Non Reactive (03/11 1115) NR Flu vaccine 01/21/17 HBsAg: Negative (11/19 1007) Neg TDaP vaccine  05/13/17                Vaginal Pap smear, abnormal    Past Surgical History:  Procedure Laterality Date   NO PAST SURGERIES     Family History  Problem Relation Age of Onset   Varicose Veins Mother    Miscarriages / Stillbirths Sister    Asthma Maternal Grandmother    Diabetes Maternal Grandmother    Hypertension Maternal Grandmother    Varicose Veins Maternal Grandmother    Social History   Tobacco Use   Smoking status: Former    Packs/day: 0.50    Types: Cigarettes    Quit  date: 11/27/2016    Years since quitting: 4.7   Smokeless tobacco: Never  Vaping Use   Vaping Use: Every day  Substance Use Topics   Alcohol use: Not Currently   Drug use: No   Allergies  Allergen Reactions   Shrimp [Shellfish Allergy] Nausea And Vomiting   Tomato Itching and Rash   Current Outpatient Medications on File Prior to Visit  Medication Sig Dispense Refill   Blood Pressure Monitoring (BLOOD PRESSURE KIT) DEVI 1 Device by Does not apply route once a week. 1 each 0   Prenatal MV-Min-Fe Fum-FA-DHA (PRENATAL 1 PO) Take 1 tablet by mouth daily.     Current Facility-Administered Medications on File Prior to Visit  Medication Dose Route Frequency Provider Last Rate Last Admin   medroxyPROGESTERone (DEPO-PROVERA) injection 150 mg  150 mg Intramuscular Q90 days Sharyon Cable, CNM   150 mg at 12/02/17 1159   medroxyPROGESTERone (DEPO-PROVERA) injection 150 mg  150 mg Intramuscular Q90 days Brock Bad, MD   150 mg at 07/29/18 1634    Review  of Systems Pertinent items noted in HPI and remainder of comprehensive ROS otherwise negative.  Indications for ASA therapy (per UpToDate) One of the following: Previous pregnancy with preeclampsia, especially early onset and with an adverse outcome No Multifetal gestation No Chronic hypertension No Type 1 or 2 diabetes mellitus No Chronic kidney disease No Autoimmune disease (antiphospholipid syndrome, systemic lupus erythematosus) No Two or more of the following: Nulliparity No Obesity (body mass index >30 kg/m2) No Family history of preeclampsia in mother or sister No Age ?35 years No Sociodemographic characteristics (African American race, low socioeconomic level) Yes Personal risk factors (eg, previous pregnancy with low birth weight or small for gestational age infant, previous adverse pregnancy outcome [eg, stillbirth], interval >10 years between pregnancies) No  Physical Exam:   Vitals:   08/24/21 1315  BP: 110/63   Pulse: 72  Weight: 147 lb 4.8 oz (66.8 kg)   Fetal Heart Rate (bpm): 145  Uterine size:    Patient informed that the ultrasound is considered a limited obstetric ultrasound and is not intended to be a complete ultrasound exam.  Patient also informed that the ultrasound is not being completed with the intent of assessing for fetal or placental anomalies or any pelvic abnormalities.  Explained that the purpose of today's ultrasound is to assess for fetal heart rate.  Patient acknowledges the purpose of the exam and the limitations of the study. General: well-developed, well-nourished female in no acute distress  Breasts:  normal appearance, no masses or tenderness bilaterally, exam done in the presence of a chaperone.   Skin: normal coloration and turgor, no rashes  Neurologic: oriented, normal, negative, normal mood  Extremities: normal strength, tone, and muscle mass, ROM of all joints is normal  HEENT PERRLA, extraocular movement intact and sclera clear, anicteric  Neck supple and no masses  Cardiovascular: regular rate and rhythm  Respiratory:  no respiratory distress, normal breath sounds  Abdomen: soft, non-tender; bowel sounds normal; no masses,  no organomegaly  Pelvic: normal external genitalia, no lesions, normal vaginal mucosa, normal vaginal discharge, normal cervix, pap smear done. Exam done in the presence of a chaperone.     Assessment:    Pregnancy: G2P1001 Patient Active Problem List   Diagnosis Date Noted   Supervision of other normal pregnancy, antepartum 08/16/2021   Post partum depression 05/19/2018   Generalized anxiety disorder 05/19/2018   SVD (spontaneous vaginal delivery) 08/09/2017     Plan:   1. Supervision of other normal pregnancy, antepartum  - Cytology - PAP( [place]) - Cervicovaginal ancillary only( [place]) - Culture, OB Urine - CBC/D/Plt+RPR+Rh+ABO+RubIgG...  - Genetic screen after MFM [place].    Initial labs drawn. Continue prenatal  vitamins. Problem list reviewed and updated. Genetic Screening discussed, Panorama: requested. Ultrasound discussed; fetal anatomic survey: ordered. Anticipatory guidance about prenatal visits given including labs, ultrasounds, and testing. Discussed usage of the Babyscripts app for more information about pregnancy, and to track blood pressures. Also discussed usage of virtual visits as additional source of managing and completing prenatal visits.   Patient was encouraged to use MyChart to review results, send requests, and have questions addressed.   The nature of [place] [facility] with multiple MDs and Advanced Practice Providers was explained to patient; also emphasized that residents, students are part of our team. Routine obstetric precautions reviewed. Encouraged to seek out care at office or emergency room [facility] preferred) for urgent and/or emergent concerns. No follow-ups on file.  Random Dobrowski, Harolyn Rutherford, NP [facility] for [employer], [facility]

## 2021-08-24 NOTE — Progress Notes (Signed)
Patient presents for New OB. Intake completed on 6/13. Patient has no concerns today.

## 2021-08-25 ENCOUNTER — Encounter: Payer: Self-pay | Admitting: Obstetrics and Gynecology

## 2021-08-25 ENCOUNTER — Other Ambulatory Visit: Payer: Self-pay | Admitting: Obstetrics and Gynecology

## 2021-08-25 DIAGNOSIS — A749 Chlamydial infection, unspecified: Secondary | ICD-10-CM | POA: Insufficient documentation

## 2021-08-25 LAB — CBC/D/PLT+RPR+RH+ABO+RUBIGG...
Antibody Screen: NEGATIVE
Basophils Absolute: 0 10*3/uL (ref 0.0–0.2)
Basos: 0 %
EOS (ABSOLUTE): 0.1 10*3/uL (ref 0.0–0.4)
Eos: 2 %
HCV Ab: NONREACTIVE
HIV Screen 4th Generation wRfx: NONREACTIVE
Hematocrit: 37.3 % (ref 34.0–46.6)
Hemoglobin: 12.5 g/dL (ref 11.1–15.9)
Hepatitis B Surface Ag: NEGATIVE
Immature Grans (Abs): 0 10*3/uL (ref 0.0–0.1)
Immature Granulocytes: 0 %
Lymphocytes Absolute: 2.1 10*3/uL (ref 0.7–3.1)
Lymphs: 36 %
MCH: 29.1 pg (ref 26.6–33.0)
MCHC: 33.5 g/dL (ref 31.5–35.7)
MCV: 87 fL (ref 79–97)
Monocytes Absolute: 0.4 10*3/uL (ref 0.1–0.9)
Monocytes: 6 %
Neutrophils Absolute: 3.3 10*3/uL (ref 1.4–7.0)
Neutrophils: 56 %
Platelets: 262 10*3/uL (ref 150–450)
RBC: 4.3 x10E6/uL (ref 3.77–5.28)
RDW: 13.4 % (ref 11.7–15.4)
RPR Ser Ql: NONREACTIVE
Rh Factor: POSITIVE
Rubella Antibodies, IGG: 4.7 index (ref >0.99)
WBC: 5.9 10*3/uL (ref 3.4–10.8)

## 2021-08-25 LAB — CERVICOVAGINAL ANCILLARY ONLY
Chlamydia: POSITIVE — AB
Comment: NEGATIVE
Comment: NEGATIVE
Comment: NORMAL
Neisseria Gonorrhea: NEGATIVE
Trichomonas: NEGATIVE

## 2021-08-25 LAB — CYTOLOGY - PAP: Diagnosis: NEGATIVE

## 2021-08-25 LAB — HCV INTERPRETATION

## 2021-08-25 MED ORDER — AZITHROMYCIN 500 MG PO TABS: 2000.0000 mg | ORAL_TABLET | Freq: Once | ORAL | 0 refills | Status: AC

## 2021-08-26 LAB — URINE CULTURE, OB REFLEX

## 2021-08-26 LAB — CULTURE, OB URINE

## 2021-09-21 ENCOUNTER — Ambulatory Visit (INDEPENDENT_AMBULATORY_CARE_PROVIDER_SITE_OTHER): Payer: BC Managed Care – PPO | Admitting: Licensed Clinical Social Worker

## 2021-09-21 ENCOUNTER — Encounter: Payer: Self-pay | Admitting: Student

## 2021-09-21 ENCOUNTER — Ambulatory Visit (INDEPENDENT_AMBULATORY_CARE_PROVIDER_SITE_OTHER): Payer: BC Managed Care – PPO | Admitting: Student

## 2021-09-21 VITALS — BP 107/62 | HR 72 | Wt 146.6 lb

## 2021-09-21 DIAGNOSIS — Z3480 Encounter for supervision of other normal pregnancy, unspecified trimester: Secondary | ICD-10-CM | POA: Diagnosis not present

## 2021-09-21 DIAGNOSIS — A749 Chlamydial infection, unspecified: Secondary | ICD-10-CM

## 2021-09-21 DIAGNOSIS — Z8659 Personal history of other mental and behavioral disorders: Secondary | ICD-10-CM

## 2021-09-21 DIAGNOSIS — Z3A15 15 weeks gestation of pregnancy: Secondary | ICD-10-CM

## 2021-09-21 DIAGNOSIS — Z8759 Personal history of other complications of pregnancy, childbirth and the puerperium: Secondary | ICD-10-CM

## 2021-09-21 NOTE — Progress Notes (Signed)
Pt presents for ROB visit. No concerns at this time.  

## 2021-09-21 NOTE — BH Specialist Note (Signed)
Integrated Behavioral Health Initial In-Person Visit  MRN: [id number] Name: Casie Sturgeon  Number of Integrated Behavioral Health Clinician visits: 1 Session Start time:   150pm Session End time: 205pm Total time in minutes: 15 mins  Types of Service: General Behavioral Integrated Care (BHI)  Interpretor:No. Interpretor Name and Language: none   Warm Hand Off Completed.        Subjective: Iliza Blankenbeckler is a 31 y.o. female accompanied by Father of child Patient was referred by Florene Route NP for hx of postpartum depression. Patient reports the following symptoms/concerns: hx of postpartum depression Duration of problem: over one year ; Severity of problem: mild  Objective: Mood: good and Affect: Appropriate Risk of harm to self or others: No plan to harm self or others  Life Context: Family and Social: Lives with partner and daughter School/Work: [employer]  Self-Care: n/a Life Changes: new pregnancy  Patient and/or Family's Strengths/Protective Factors: Concrete supports in place (healthy food, safe environments, etc.)  Goals Addressed: Patient will: Reduce symptoms of: depression Increase knowledge and/or ability of: coping skills  Demonstrate ability to: Increase healthy adjustment to current life circumstances  Progress towards Goals: Ongoing  Interventions: Interventions utilized: Motivational Interviewing  Standardized Assessments completed: PHQ 9  Patient and/or Family Response: Ms. Linenberger responded well to visi  Assessment: Patient reports history of postpartum depression.  Patient may benefit from integrated behavioral health.  Plan: Follow up with behavioral health clinician on : prn Behavioral recommendations: priortize rest, communicate needs with partner, listen to music and prenatal yoga to reduce stress  Referral(s): Integrated [employer] (In Clinic) "From scale of 1-10, how likely are you to follow plan?":    Gwyndolyn Saxon, LCSW

## 2021-09-22 NOTE — Progress Notes (Signed)
   PRENATAL VISIT NOTE  Subjective:  Alexandra Hart is a 31 y.o. G2P1001 at [redacted]w[redacted]d being seen today for ongoing prenatal care.  She is currently monitored for the following issues for this low-risk pregnancy and has SVD (spontaneous vaginal delivery); Post partum depression; Generalized anxiety disorder; Supervision of other normal pregnancy, antepartum; and Chlamydia infection on their problem list.  Patient reports no complaints.  Contractions: Not present. Vag. Bleeding: None.  Movement: Absent. Denies leaking of fluid.   The following portions of the patient's history were reviewed and updated as appropriate: allergies, current medications, past family history, past medical history, past social history, past surgical history and problem list.   Objective:   Vitals:   09/21/21 1331  BP: 107/62  Pulse: 72  Weight: 146 lb 9.6 oz (66.5 kg)    Fetal Status: Fetal Heart Rate (bpm): 143   Movement: Absent     General:  Alert, oriented and cooperative. Patient is in no acute distress.  Skin: Skin is warm and dry. No rash noted.   Cardiovascular: Normal heart rate noted  Respiratory: Normal respiratory effort, no problems with respiration noted  Abdomen: Soft, gravid, appropriate for gestational age.  Pain/Pressure: Absent     Pelvic: Cervical exam deferred        Extremities: Normal range of motion.  Edema: None  Mental Status: Normal mood and affect. Normal behavior. Normal judgment and thought content.   Assessment and Plan:  Pregnancy: G2P1001 at [redacted]w[redacted]d 1. Supervision of other normal pregnancy, antepartum - Doing well, feels baby flutters - AFP, Serum, Open Spina Bifida - HORIZON CUSTOM - Panorama Prenatal Test Full Panel  2. [redacted] weeks gestation of pregnancy - Routine follow-up - AFP, Serum, Open Spina Bifida - HORIZON CUSTOM - Panorama Prenatal Test Full Panel - Anatomy U/S scheduled  3. Chlamydia infection - Recommend TOC at next visit  Preterm labor symptoms and general  obstetric precautions including but not limited to vaginal bleeding, contractions, leaking of fluid and fetal movement were reviewed in detail with the patient. Please refer to After Visit Summary for other counseling recommendations.   No follow-ups on file.  Future Appointments  Date Time Provider Department Center  10/13/2021  1:00 PM [facility] US1 [facility]  10/19/2021  8:35 AM Corlis Hove, NP [facility] None    Corlis Hove, NP

## 2021-09-23 LAB — AFP, SERUM, OPEN SPINA BIFIDA
AFP MoM: 1.16
AFP Value: 39.3 ng/mL
Gest. Age on Collection Date: 15 weeks
Maternal Age At EDD: 29.5 yr
OSBR Risk 1 IN: 10000
Test Results:: NEGATIVE
Weight: 146 [lb_av]

## 2021-09-28 ENCOUNTER — Encounter: Payer: Self-pay | Admitting: Student

## 2021-10-13 ENCOUNTER — Ambulatory Visit: Payer: BC Managed Care – PPO | Attending: Obstetrics and Gynecology

## 2021-10-13 DIAGNOSIS — O321XX0 Maternal care for breech presentation, not applicable or unspecified: Secondary | ICD-10-CM | POA: Diagnosis not present

## 2021-10-13 DIAGNOSIS — Z3480 Encounter for supervision of other normal pregnancy, unspecified trimester: Secondary | ICD-10-CM | POA: Diagnosis not present

## 2021-10-13 DIAGNOSIS — Z3A18 18 weeks gestation of pregnancy: Secondary | ICD-10-CM | POA: Diagnosis not present

## 2021-10-13 DIAGNOSIS — Z363 Encounter for antenatal screening for malformations: Secondary | ICD-10-CM | POA: Diagnosis not present

## 2021-10-19 ENCOUNTER — Ambulatory Visit (INDEPENDENT_AMBULATORY_CARE_PROVIDER_SITE_OTHER): Payer: BC Managed Care – PPO | Admitting: Student

## 2021-10-19 ENCOUNTER — Other Ambulatory Visit (HOSPITAL_COMMUNITY)
Admission: RE | Admit: 2021-10-19 | Discharge: 2021-10-19 | Disposition: A | Discharge status: Home / Self Care | Payer: BC Managed Care – PPO | Source: Ambulatory Visit | Attending: Student | Admitting: Student

## 2021-10-19 VITALS — BP 119/73 | HR 79 | Wt 150.6 lb

## 2021-10-19 DIAGNOSIS — Z8619 Personal history of other infectious and parasitic diseases: Secondary | ICD-10-CM | POA: Insufficient documentation

## 2021-10-19 DIAGNOSIS — A749 Chlamydial infection, unspecified: Secondary | ICD-10-CM | POA: Diagnosis not present

## 2021-10-19 DIAGNOSIS — Z3482 Encounter for supervision of other normal pregnancy, second trimester: Secondary | ICD-10-CM | POA: Diagnosis not present

## 2021-10-19 DIAGNOSIS — Z3A19 19 weeks gestation of pregnancy: Secondary | ICD-10-CM

## 2021-10-19 DIAGNOSIS — Z3480 Encounter for supervision of other normal pregnancy, unspecified trimester: Secondary | ICD-10-CM

## 2021-10-19 NOTE — Progress Notes (Signed)
Pt reports fetal movement, denies pain. TOC for +CT today

## 2021-10-19 NOTE — Progress Notes (Signed)
   PRENATAL VISIT NOTE  Subjective:  Alexandra Hart is a 31 y.o. G2P1001 at [redacted]w[redacted]d being seen today for ongoing prenatal care.  She is currently monitored for the following issues for this low-risk pregnancy and has SVD (spontaneous vaginal delivery); Post partum depression; Generalized anxiety disorder; Supervision of other normal pregnancy, antepartum; and Chlamydia infection on their problem list.  Patient reports  some mild bloating, but otherwise feels well .  Contractions: Not present. Vag. Bleeding: None.  Movement: Present. Denies leaking of fluid.   The following portions of the patient's history were reviewed and updated as appropriate: allergies, current medications, past family history, past medical history, past social history, past surgical history and problem list.   Objective:   Vitals:   10/19/21 0837  BP: 119/73  Pulse: 79  Weight: 150 lb 9.6 oz (68.3 kg)    Fetal Status: Fetal Heart Rate (bpm): 141   Movement: Present     General:  Alert, oriented and cooperative. Patient is in no acute distress.  Skin: Skin is warm and dry. No rash noted.   Cardiovascular: Normal heart rate noted  Respiratory: Normal respiratory effort, no problems with respiration noted  Abdomen: Soft, gravid, appropriate for gestational age.  Pain/Pressure: Present     Pelvic: Cervical exam deferred        Extremities: Normal range of motion.  Edema: None  Mental Status: Normal mood and affect. Normal behavior. Normal judgment and thought content.   Assessment and Plan:  Pregnancy: G2P1001 at [redacted]w[redacted]d 1. Supervision of other normal pregnancy, antepartum - Doing well, feels baby movement  2. [redacted] weeks gestation of pregnancy - Routine follow-up planned - Anatomy scan completed- Normal  3. Chlamydia infection - TOC collected today - Cervicovaginal ancillary only  Preterm labor symptoms and general obstetric precautions including but not limited to vaginal bleeding, contractions, leaking of  fluid and fetal movement were reviewed in detail with the patient. Please refer to After Visit Summary for other counseling recommendations.   Return in about 4 weeks (around 11/16/2021) for LOB, IN-PERSON.  No future appointments.  Corlis Hove, NP

## 2021-10-20 LAB — CERVICOVAGINAL ANCILLARY ONLY
Chlamydia: NEGATIVE
Comment: NEGATIVE
Comment: NORMAL
Neisseria Gonorrhea: NEGATIVE

## 2021-11-16 ENCOUNTER — Encounter: Payer: Self-pay | Admitting: Obstetrics and Gynecology

## 2021-11-16 ENCOUNTER — Ambulatory Visit (INDEPENDENT_AMBULATORY_CARE_PROVIDER_SITE_OTHER): Payer: BC Managed Care – PPO | Admitting: Obstetrics and Gynecology

## 2021-11-16 VITALS — BP 109/60 | HR 79 | Wt 155.0 lb

## 2021-11-16 DIAGNOSIS — Z3A23 23 weeks gestation of pregnancy: Secondary | ICD-10-CM

## 2021-11-16 DIAGNOSIS — Z3480 Encounter for supervision of other normal pregnancy, unspecified trimester: Secondary | ICD-10-CM

## 2021-11-16 DIAGNOSIS — Z3482 Encounter for supervision of other normal pregnancy, second trimester: Secondary | ICD-10-CM

## 2021-11-16 NOTE — Progress Notes (Signed)
   LOW-RISK PREGNANCY OFFICE VISIT Patient name: Alexandra Hart MRN [id number]  Date of birth: [birth year] Chief Complaint:   Routine Prenatal Visit  History of Present Illness:   Alexandra Hart is a 31 y.o. G38P1001 female at [redacted]w[redacted]d with an Estimated Date of Delivery: 03/10/22 being seen today for ongoing management of a low-risk pregnancy.  Today she reports no complaints. Contractions: Not present. Vag. Bleeding: None.  Movement: Present. denies leaking of fluid. Review of Systems:   Pertinent items are noted in HPI Denies abnormal vaginal discharge w/ itching/odor/irritation, headaches, visual changes, shortness of breath, chest pain, abdominal pain, severe nausea/vomiting, or problems with urination or bowel movements unless otherwise stated above. Pertinent History Reviewed:  Reviewed past medical,surgical, social, obstetrical and family history.  Reviewed problem list, medications and allergies. Physical Assessment:   Vitals:   11/16/21 1520  BP: 109/60  Pulse: 79  Weight: 155 lb (70.3 kg)  Body mass index is 29.29 kg/m.        Physical Examination:   General appearance: Well appearing, and in no distress  Mental status: Alert, oriented to person, place, and time  Skin: Warm & dry  Cardiovascular: Normal heart rate noted  Respiratory: Normal respiratory effort, no distress  Abdomen: Soft, gravid, nontender  Pelvic: Cervical exam deferred         Extremities:    Fetal Status: Fetal Heart Rate (bpm): 145 Fundal Height: 24 cm Movement: Present    No results found for this or any previous visit (from the past 24 hour(s)).  Assessment & Plan:  1) Low-risk pregnancy G2P1001 at [redacted]w[redacted]d with an Estimated Date of Delivery: 03/10/22   2) Supervision of other normal pregnancy, antepartum - Anticipatory guidance for 2 hr GTT - advised to fast after midnight without anything to eat or drink (except for water), will have fasting blood drawn, drink the glucola drink (flavor choices: orange or  fruit punch), have a visit with a provider during the first hour of testing, wait in the lab waiting room to have blood drawn at 1 hour and then 2 hours after finishing glucola drink.   3) [redacted] weeks gestation of pregnancy    Meds: No orders of the defined types were placed in this encounter.  Labs/procedures today: none  Plan:  Continue routine obstetrical care   Reviewed: Preterm labor symptoms and general obstetric precautions including but not limited to vaginal bleeding, contractions, leaking of fluid and fetal movement were reviewed in detail with the patient.  All questions were answered. Has home bp cuff. Check bp weekly, let us know if >140/90.   Follow-up: Return in about 4 weeks (around 12/14/2021) for Return OB 2hr GTT.  No orders of the defined types were placed in this encounter.  Raelyn Mora MSN, CNM 11/16/2021 3:33 PM

## 2021-11-27 ENCOUNTER — Encounter: Payer: Self-pay | Admitting: *Deleted

## 2021-11-29 LAB — PANORAMA PRENATAL TEST FULL PANEL:PANORAMA TEST PLUS 5 ADDITIONAL MICRODELETIONS: FETAL FRACTION: 10.1

## 2021-11-29 LAB — HORIZON CUSTOM: REPORT SUMMARY: NEGATIVE

## 2021-12-13 ENCOUNTER — Encounter: Payer: Self-pay | Admitting: Obstetrics and Gynecology

## 2021-12-14 ENCOUNTER — Encounter: Payer: Self-pay | Admitting: Obstetrics and Gynecology

## 2021-12-14 ENCOUNTER — Ambulatory Visit (INDEPENDENT_AMBULATORY_CARE_PROVIDER_SITE_OTHER): Payer: BC Managed Care – PPO | Admitting: Obstetrics and Gynecology

## 2021-12-14 ENCOUNTER — Other Ambulatory Visit: Payer: BC Managed Care – PPO

## 2021-12-14 VITALS — BP 117/72 | HR 74 | Wt 160.0 lb

## 2021-12-14 DIAGNOSIS — Z3482 Encounter for supervision of other normal pregnancy, second trimester: Secondary | ICD-10-CM

## 2021-12-14 DIAGNOSIS — Z3480 Encounter for supervision of other normal pregnancy, unspecified trimester: Secondary | ICD-10-CM | POA: Diagnosis not present

## 2021-12-14 DIAGNOSIS — Z3A27 27 weeks gestation of pregnancy: Secondary | ICD-10-CM

## 2021-12-14 NOTE — Patient Instructions (Signed)
Fetal Movement Counts Patient Name: ________________________________________________ Patient Due Date: ____________________ What is a fetal movement count?  A fetal movement count is the number of times that you feel your baby move during a certain amount of time. This may also be called a fetal kick count. A fetal movement count is recommended for every pregnant woman. You may be asked to start counting fetal movements as early as week 28 of your pregnancy. Pay attention to when your baby is most active. You may notice your baby's sleep and wake cycles. You may also notice things that make your baby move more. You should do a fetal movement count: When your baby is normally most active. At the same time each day. A good time to count movements is while you are resting, after having something to eat and drink. How do I count fetal movements? Find a quiet, comfortable area. Sit, or lie down on your side. Write down the date, the start time and stop time, and the number of movements that you felt between those two times. Take this information with you to your health care visits. Write down your start time when you feel the first movement. Count kicks, flutters, swishes, rolls, and jabs. You should feel at least 10 movements. You may stop counting after you have felt 10 movements, or if you have been counting for 2 hours. Write down the stop time. If you do not feel 10 movements in 2 hours, contact your health care provider for further instructions. Your health care provider may want to do additional tests to assess your baby's well-being. Contact a health care provider if: You feel fewer than 10 movements in 2 hours. Your baby is not moving like he or she usually does. Date: ____________ Start time: ____________ Stop time: ____________ Movements: ____________ Date: ____________ Start time: ____________ Stop time: ____________ Movements: ____________ Date: ____________ Start time: ____________ Stop  time: ____________ Movements: ____________ Date: ____________ Start time: ____________ Stop time: ____________ Movements: ____________ Date: ____________ Start time: ____________ Stop time: ____________ Movements: ____________ Date: ____________ Start time: ____________ Stop time: ____________ Movements: ____________ Date: ____________ Start time: ____________ Stop time: ____________ Movements: ____________ Date: ____________ Start time: ____________ Stop time: ____________ Movements: ____________ Date: ____________ Start time: ____________ Stop time: ____________ Movements: ____________ This information is not intended to replace advice given to you by your health care provider. Make sure you discuss any questions you have with your health care provider. Document Revised: 10/09/2018 Document Reviewed: 10/09/2018 Elsevier Patient Education  2023 Elsevier Inc.  

## 2021-12-14 NOTE — Progress Notes (Signed)
   LOW-RISK PREGNANCY OFFICE VISIT Patient name: Alexandra Hart MRN [id number]  Date of birth: [birth year] Chief Complaint:   Routine Prenatal Visit  History of Present Illness:   Alexandra Hart is a 31 y.o. G2P1001 female at 100w5d with an Estimated Date of Delivery: 03/10/22 being seen today for ongoing management of a low-risk pregnancy.  Today she reports no complaints. Contractions: Not present. Vag. Bleeding: None.  Movement: Present. denies leaking of fluid. Review of Systems:   Pertinent items are noted in HPI Denies abnormal vaginal discharge w/ itching/odor/irritation, headaches, visual changes, shortness of breath, chest pain, abdominal pain, severe nausea/vomiting, or problems with urination or bowel movements unless otherwise stated above. Pertinent History Reviewed:  Reviewed past medical,surgical, social, obstetrical and family history.  Reviewed problem list, medications and allergies. Physical Assessment:   Vitals:   12/14/21 0928  BP: 117/72  Pulse: 74  Weight: 160 lb (72.6 kg)  Body mass index is 30.23 kg/m.        Physical Examination:   General appearance: Well appearing, and in no distress  Mental status: Alert, oriented to person, place, and time  Skin: Warm & dry  Cardiovascular: Normal heart rate noted  Respiratory: Normal respiratory effort, no distress  Abdomen: Soft, gravid, nontender  Pelvic: Cervical exam deferred         Extremities: Edema: Trace  Fetal Status: Fetal Heart Rate (bpm): 152 Fundal Height: 27 cm Movement: Present    No results found for this or any previous visit (from the past 24 hour(s)).  Assessment & Plan:  1) Low-risk pregnancy G2P1001 at [redacted]w[redacted]d with an Estimated Date of Delivery: 03/10/22   2) Supervision of other normal pregnancy, antepartum  - Glucose Tolerance, 2 Hours w/1 Hour,  - RPR,  - CBC,  - HIV antibody (with reflex)  3) [redacted] weeks gestation of pregnancy    Meds: No orders of the defined types were placed in this  encounter.  Labs/procedures today: 2 hr GTT and 3rd trimester labs  Plan:  Continue routine obstetrical care   Reviewed: Preterm labor symptoms and general obstetric precautions including but not limited to vaginal bleeding, contractions, leaking of fluid and fetal movement were reviewed in detail with the patient.  All questions were answered. Has home bp cuff. Check bp weekly, let us know if >140/90.   Follow-up: Return in about 2 weeks (around 12/28/2021) for Return OB visit.  Orders Placed This Encounter  Procedures   Glucose Tolerance, 2 Hours w/1 Hour   RPR   CBC   HIV antibody (with reflex)   Laury Deep MSN, CNM 12/14/2021 11:00 AM

## 2021-12-14 NOTE — Progress Notes (Signed)
ROB/GTT.  Declined FLU and TDap vaccines. 

## 2021-12-15 LAB — RPR: RPR Ser Ql: NONREACTIVE

## 2021-12-15 LAB — CBC
Hematocrit: 34.6 % (ref 34.0–46.6)
Hemoglobin: 11.6 g/dL (ref 11.1–15.9)
MCH: 29.8 pg (ref 26.6–33.0)
MCHC: 33.5 g/dL (ref 31.5–35.7)
MCV: 89 fL (ref 79–97)
Platelets: 201 10*3/uL (ref 150–450)
RBC: 3.89 x10E6/uL (ref 3.77–5.28)
RDW: 13.1 % (ref 11.7–15.4)
WBC: 8.1 10*3/uL (ref 3.4–10.8)

## 2021-12-15 LAB — HIV ANTIBODY (ROUTINE TESTING W REFLEX): HIV Screen 4th Generation wRfx: NONREACTIVE

## 2021-12-15 LAB — GLUCOSE TOLERANCE, 2 HOURS W/ 1HR
Glucose, 1 hour: 160 mg/dL (ref 70–179)
Glucose, 2 hour: 140 mg/dL (ref 70–152)
Glucose, Fasting: 69 mg/dL — ABNORMAL LOW (ref 70–91)

## 2021-12-18 ENCOUNTER — Encounter: Payer: Self-pay | Admitting: Obstetrics and Gynecology

## 2021-12-28 ENCOUNTER — Ambulatory Visit (INDEPENDENT_AMBULATORY_CARE_PROVIDER_SITE_OTHER): Payer: BC Managed Care – PPO

## 2021-12-28 VITALS — BP 111/60 | HR 84 | Wt 159.6 lb

## 2021-12-28 DIAGNOSIS — Z3483 Encounter for supervision of other normal pregnancy, third trimester: Secondary | ICD-10-CM

## 2021-12-28 DIAGNOSIS — Z3A29 29 weeks gestation of pregnancy: Secondary | ICD-10-CM

## 2021-12-28 DIAGNOSIS — K429 Umbilical hernia without obstruction or gangrene: Secondary | ICD-10-CM

## 2021-12-28 DIAGNOSIS — Z3480 Encounter for supervision of other normal pregnancy, unspecified trimester: Secondary | ICD-10-CM

## 2021-12-28 NOTE — Progress Notes (Signed)
Patient presents for ROB. Patient has concerns about having pressure behind her belly button. No other concerns.

## 2021-12-28 NOTE — Progress Notes (Signed)
   LOW-RISK PREGNANCY OFFICE VISIT  Patient name: Alexandra Hart MRN [id number]  Date of birth: [birth year] Chief Complaint:   Routine Prenatal Visit  Subjective:   Alexandra Hart is a 31 y.o. G2P1001 female at [redacted]w[redacted]d with an Estimated Date of Delivery: 03/10/22 being seen today for ongoing management of a low-risk pregnancy aeb has SVD (spontaneous vaginal delivery); Post partum depression; Generalized anxiety disorder; Supervision of other normal pregnancy, antepartum; and Chlamydia infection on their problem list.  Patient presents today with Vincent-FOB and expresses concern with  cold-like symptoms .  She states she has been using cough drops and vicks with no relief. She reports some nasal congestion with bleeding after blowing her nose. She also reports some pressure behind her belly button that is constant. She states it was initially with fetal movement. She states she was told, as a child, that she had an umbilical hernia. She denies issues with constipation. Patient endorses fetal movement. Patient denies abdominal cramping or contractions.  Patient denies vaginal concerns including abnormal discharge, leaking of fluid, and bleeding. No issues with urination, constipation, or diarrhea.   Contractions: Not present. Vag. Bleeding: None.  Movement: Present.  Reviewed past medical,surgical, social, obstetrical and family history as well as problem list, medications and allergies.  Objective   Vitals:   12/28/21 1553  BP: 111/60  Pulse: 84  Weight: 159 lb 9.6 oz (72.4 kg)  Body mass index is 30.16 kg/m.  Total Weight Gain:13 lb 9.6 oz (6.169 kg)         Physical Examination:   General appearance: Well appearing, and in no distress  Mental status: Alert, oriented to person, place, and time  Skin: Warm & dry  Cardiovascular: Normal heart rate noted  Respiratory: Normal respiratory effort, no distress  Abdomen: Soft, gravid, nontender, AGA with Fundal Height: 29 cm Direct reducible  umbilical hernia noted. Mild tenderness with reducing, but easily achieved. Opening ~ 1-2cm.  Pelvic: Cervical exam deferred           Extremities: Edema: None  Fetal Status: Fetal Heart Rate (bpm): 150  Movement: Present   No results found for this or any previous visit (from the past 24 hour(s)).  Assessment & Plan:  Low-risk pregnancy of a 31 y.o., G2P1001 at [redacted]w[redacted]d with an Estimated Date of Delivery: 03/10/22   1. Supervision of other normal pregnancy, antepartum -Anticipatory guidance for upcoming appts. -Patient to schedule next appt in 2 weeks for an in-person visit. -Instructed to bring in WB consent. -Briefly discussed WB process.   2. [redacted] weeks gestation of pregnancy -Addressed concerns. -Given list of pregnancy safe medications.   3. Umbilical hernia without obstruction and without gangrene -Discussed follow up with general surgery after delivery. -Instructed to monitor and if symptoms worsening to notify office for earlier consult.      Meds: No orders of the defined types were placed in this encounter.  Labs/procedures today:  Lab Orders  No laboratory test(s) ordered today     Reviewed: Preterm labor symptoms and general obstetric precautions including but not limited to vaginal bleeding, contractions, leaking of fluid and fetal movement were reviewed in detail with the patient.  All questions were answered.  Follow-up: No follow-ups on file.  No orders of the defined types were placed in this encounter.  Maryann Conners MSN, CNM 12/28/2021

## 2021-12-28 NOTE — Patient Instructions (Signed)

## 2022-01-02 ENCOUNTER — Ambulatory Visit (HOSPITAL_COMMUNITY)
Admission: EM | Admit: 2022-01-02 | Discharge: 2022-01-02 | Disposition: A | Discharge status: Home / Self Care | Payer: BC Managed Care – PPO | Attending: Emergency Medicine | Admitting: Emergency Medicine

## 2022-01-02 ENCOUNTER — Encounter (HOSPITAL_COMMUNITY): Payer: Self-pay | Admitting: *Deleted

## 2022-01-02 DIAGNOSIS — B349 Viral infection, unspecified: Secondary | ICD-10-CM | POA: Diagnosis not present

## 2022-01-02 DIAGNOSIS — O99830 Other infection carrier state complicating pregnancy: Secondary | ICD-10-CM

## 2022-01-02 DIAGNOSIS — H1032 Unspecified acute conjunctivitis, left eye: Secondary | ICD-10-CM

## 2022-01-02 DIAGNOSIS — O98519 Other viral diseases complicating pregnancy, unspecified trimester: Secondary | ICD-10-CM

## 2022-01-02 DIAGNOSIS — Z3490 Encounter for supervision of normal pregnancy, unspecified, unspecified trimester: Secondary | ICD-10-CM

## 2022-01-02 MED ORDER — POLYMYXIN B-TRIMETHOPRIM 10000-0.1 UNIT/ML-% OP SOLN: 1.0000 [drp] | OPHTHALMIC | 0 refills | Status: AC

## 2022-01-02 NOTE — Discharge Instructions (Signed)
Please refer to the list of medications safe to use in pregnancy.  I recommend adding daily allergy medicine with or without benadryl for nasal congestion.  Please use the eye drops as prescribed.

## 2022-01-02 NOTE — ED Provider Notes (Signed)
[place]    CSN: [id number] Arrival date & time: 01/02/22  1146     History   Chief Complaint Chief Complaint  Patient presents with   Cough   Nasal Congestion   Conjunctivitis    HPI Alexandra Hart is a 31 y.o. female.  Presents with 5 day history of nasal congestion and cough Reports trying robitussin but she is currently pregnant and afraid to take medication Ob/gyn saw her and gave list of medications safe to take but she has been worried about it  No known sick contacts. No fever  Left eye red and painful last night Stuck shut this morning Some mucous drainage today  Past Medical History:  Diagnosis Date   Generalized anxiety disorder 05/19/2018   Headache    Post partum depression 05/19/2018   Supervision of normal first pregnancy, antepartum 12/27/2016    Clinic  [facility] Prenatal Labs Dating   LMP Blood type: O/Positive/-- (11/19 1007) O pos Genetic Screen 1 Screen: nl NT    AFP:  Negative  Antibody:Negative (11/19 1007)Neg Anatomic [place] normal Rubella: 4.92 (11/19 1007)Immune GTT Early:               Third trimester: nl 2 hour RPR: Non Reactive (03/11 1115) NR Flu vaccine 01/21/17 HBsAg: Negative (11/19 1007) Neg TDaP vaccine  05/13/17                Vaginal Pap smear, abnormal     Patient Active Problem List   Diagnosis Date Noted   Umbilical hernia without obstruction and without gangrene 12/28/2021   Chlamydia infection 08/25/2021   Supervision of other normal pregnancy, antepartum 08/16/2021   Post partum depression 05/19/2018   Generalized anxiety disorder 05/19/2018   SVD (spontaneous vaginal delivery) 08/09/2017    Past Surgical History:  Procedure Laterality Date   NO PAST SURGERIES      OB History     Gravida  2   Para  1   Term  1   Preterm      AB      Living  1      SAB      IAB      Ectopic      Multiple  1   Live Births  1            Home Medications    Prior to Admission medications    Medication Sig Start Date End Date Taking? Authorizing Provider  Prenatal MV-Min-Fe Fum-FA-DHA (PRENATAL 1 PO) Take 1 tablet by mouth daily.   Yes [provider]  trimethoprim-polymyxin b (POLYTRIM) ophthalmic solution Place 1 drop into the left eye every 4 (four) hours for 5 days. 01/02/22 01/07/22 Yes Imberly Troxler, Wells Guiles, PA-C  Blood Pressure Monitoring (BLOOD PRESSURE KIT) DEVI 1 Device by Does not apply route once a week. 08/16/21   Chancy Milroy, MD    Family History Family History  Problem Relation Age of Onset   Varicose Veins Mother    Miscarriages / Stillbirths Sister    Asthma Maternal Grandmother    Diabetes Maternal Grandmother    Hypertension Maternal Grandmother    Varicose Veins Maternal Grandmother     Social History Social History   Tobacco Use   Smoking status: Former    Packs/day: 0.50    Types: Cigarettes    Quit date: 11/27/2016    Years since quitting: 5.1   Smokeless tobacco: Never  Vaping Use   Vaping Use: Every day  Substance  Use Topics   Alcohol use: Not Currently   Drug use: No     Allergies   Shrimp [shellfish allergy] and Tomato   Review of Systems Review of Systems  Respiratory:  Positive for cough.    Per HPI  Physical Exam Triage Vital Signs ED Triage Vitals  Enc Vitals Group     BP 01/02/22 1245 (!) 105/54     Pulse Rate 01/02/22 1245 77     Resp 01/02/22 1245 18     Temp 01/02/22 1245 98.4 F (36.9 C)     Temp Source 01/02/22 1245 Oral     SpO2 01/02/22 1245 98 %     Weight --      Height --      Head Circumference --      Peak Flow --      Pain Score 01/02/22 1244 0     Pain Loc --      Pain Edu? --      Excl. in [place]? --    No data found.  Updated Vital Signs BP (!) 105/54 (BP Location: Left Arm)   Pulse 77   Temp 98.4 F (36.9 C) (Oral)   Resp 18   LMP 06/03/2021   SpO2 98%     Physical Exam Vitals and nursing note reviewed.  Constitutional:      General: She is not in acute distress. HENT:      Nose: Congestion present.     Mouth/Throat:     Mouth: Mucous membranes are moist.     Pharynx: Oropharynx is clear. No oropharyngeal exudate or posterior oropharyngeal erythema.  Eyes:     General:        Left eye: Discharge present.    Comments: Let eye injected with green mucous discharge   Cardiovascular:     Rate and Rhythm: Normal rate and regular rhythm.     Pulses: Normal pulses.     Heart sounds: Normal heart sounds.  Pulmonary:     Effort: Pulmonary effort is normal.     Breath sounds: Normal breath sounds.  Abdominal:     Tenderness: There is no abdominal tenderness. There is no guarding.     Comments: gravid  Musculoskeletal:     Cervical back: Normal range of motion.  Lymphadenopathy:     Cervical: No cervical adenopathy.  Skin:    General: Skin is warm and dry.  Neurological:     Mental Status: She is alert and oriented to person, place, and time.      UC Treatments / Results  Labs (all labs ordered are listed, but only abnormal results are displayed) Labs Reviewed - No data to display  EKG   Radiology No results found.  Procedures Procedures (including critical care time)  Medications Ordered in UC Medications - No data to display  Initial Impression / Assessment and Plan / UC Course  I have reviewed the triage vital signs and the nursing notes.  Pertinent labs & imaging results that were available during my care of the patient were reviewed by me and considered in my medical decision making (see chart for details).  Provided list of medications safe in preg, discussed they are safe and can be used to help improve her symptoms Reassured they have been tested and won't harm fetus Recommend add daily allergy med/benadryl for her congestion  Polytrim eye drops q4 hours for bac conjunc  Will return or follow up with PCP/OB as needed  Final Clinical Impressions(s) /  UC Diagnoses   Final diagnoses:  Acute bacterial conjunctivitis of left eye   Viral illness  Pregnancy, unspecified gestational age     Discharge Instructions      Please refer to the list of medications safe to use in pregnancy.  I recommend adding daily allergy medicine with or without benadryl for nasal congestion.  Please use the eye drops as prescribed.     ED Prescriptions     Medication Sig Dispense Auth. Provider   trimethoprim-polymyxin b (POLYTRIM) ophthalmic solution Place 1 drop into the left eye every 4 (four) hours for 5 days. 10 mL Amani Nodarse, Wells Guiles, PA-C      PDMP not reviewed this encounter.   Burleigh Brockmann, Wells Guiles, PA-C 01/02/22 1340

## 2022-01-02 NOTE — ED Triage Notes (Signed)
Pt states that last night her left eye was red and painful. She has also had a cough and runny nose x 2 weeks. She is taking robitussin but is scared to take anything since she is pregnant.

## 2022-01-03 DIAGNOSIS — Z419 Encounter for procedure for purposes other than remedying health state, unspecified: Secondary | ICD-10-CM | POA: Diagnosis not present

## 2022-01-12 ENCOUNTER — Encounter: Payer: BC Managed Care – PPO | Admitting: Advanced Practice Midwife

## 2022-01-29 ENCOUNTER — Ambulatory Visit (INDEPENDENT_AMBULATORY_CARE_PROVIDER_SITE_OTHER): Payer: BC Managed Care – PPO | Admitting: Advanced Practice Midwife

## 2022-01-29 VITALS — BP 122/65 | HR 84 | Wt 162.4 lb

## 2022-01-29 DIAGNOSIS — Z3480 Encounter for supervision of other normal pregnancy, unspecified trimester: Secondary | ICD-10-CM

## 2022-01-29 DIAGNOSIS — A749 Chlamydial infection, unspecified: Secondary | ICD-10-CM

## 2022-01-29 DIAGNOSIS — Z3A34 34 weeks gestation of pregnancy: Secondary | ICD-10-CM

## 2022-01-29 DIAGNOSIS — Z3483 Encounter for supervision of other normal pregnancy, third trimester: Secondary | ICD-10-CM

## 2022-01-29 NOTE — Progress Notes (Signed)
   PRENATAL VISIT NOTE  Subjective:  Alexandra Hart is a 31 y.o. G2P1001 at [redacted]w[redacted]d being seen today for ongoing prenatal care.  She is currently monitored for the following issues for this low-risk pregnancy and has SVD (spontaneous vaginal delivery); Post partum depression; Generalized anxiety disorder; Supervision of other normal pregnancy, antepartum; Chlamydia infection; and Umbilical hernia without obstruction and without gangrene on their problem list.  Patient reports no complaints.  Contractions: Not present. Vag. Bleeding: None.  Movement: Present. Denies leaking of fluid.   The following portions of the patient's history were reviewed and updated as appropriate: allergies, current medications, past family history, past medical history, past social history, past surgical history and problem list.   Objective:   Vitals:   01/29/22 1034  BP: 122/65  Pulse: 84  Weight: 162 lb 6.4 oz (73.7 kg)    Fetal Status: Fetal Heart Rate (bpm): 138 Fundal Height: 34 cm Movement: Present     General:  Alert, oriented and cooperative. Patient is in no acute distress.  Skin: Skin is warm and dry. No rash noted.   Cardiovascular: Normal heart rate noted  Respiratory: Normal respiratory effort, no problems with respiration noted  Abdomen: Soft, gravid, appropriate for gestational age.  Pain/Pressure: Present     Pelvic: Cervical exam deferred        Extremities: Normal range of motion.  Edema: None  Mental Status: Normal mood and affect. Normal behavior. Normal judgment and thought content.   Assessment and Plan:  Pregnancy: G2P1001 at [redacted]w[redacted]d 1. Supervision of other normal pregnancy, antepartum --Anticipatory guidance about next visits/weeks of pregnancy given.   --Desires to switch to [facility], front desk to notify [facility] --Next appt in 2 weeks, stay here at [facility] if not available at [facility]  - Pt interested in waterbirth and has attended the class with her previous pregnancy.  - Reviewed  conditions in labor that will risk her out of water immersion including thick meconium or blood stained amniotic fluid, non-reassuring fetal status on monitor, excessive bleeding, hypertension, dizziness, use of IV meds, damaged equipment or staffing that does not allow for water immersion, etc.  - The attending midwife must be on the unit for water immersion to begin; pt understands this may delay the start of water immersion. - Reminded pt that signing consent in labor at the hospital also acknowledges they will exit the tub if the attending midwife requests. - Discussed other labor support options if waterbirth becomes unavailable, including position change, freedom of movement, use of birthing ball, and/or use of hydrotherapy in the shower (dependent upon medical condition/provider discretion).    2. [redacted] weeks gestation of pregnancy   3. Chlamydia infection --Positive 08/24/21, treated.  TOC negative 10/19/21.   Preterm labor symptoms and general obstetric precautions including but not limited to vaginal bleeding, contractions, leaking of fluid and fetal movement were reviewed in detail with the patient. Please refer to After Visit Summary for other counseling recommendations.   Return in about 2 weeks (around 02/12/2022) for LOB, Midwife preferred.  Future Appointments  Date Time Provider Department Center  02/08/2022  9:55 AM Leftwich-Kirby, Wilmer Floor, CNM [facility] None  02/15/2022  9:35 AM Raelyn Mora, CNM [facility] None  02/23/2022 10:55 AM Leftwich-Kirby, Wilmer Floor, CNM [facility] None  03/02/2022 10:35 AM Raelyn Mora, CNM [facility] None    Sharen Counter, CNM

## 2022-01-29 NOTE — Progress Notes (Signed)
Pt reports fetal movement with some pressure. 

## 2022-02-02 DIAGNOSIS — Z419 Encounter for procedure for purposes other than remedying health state, unspecified: Secondary | ICD-10-CM | POA: Diagnosis not present

## 2022-02-08 ENCOUNTER — Encounter (HOSPITAL_COMMUNITY): Payer: Self-pay | Admitting: Obstetrics & Gynecology

## 2022-02-08 ENCOUNTER — Encounter: Payer: Self-pay | Admitting: Student

## 2022-02-08 ENCOUNTER — Ambulatory Visit (INDEPENDENT_AMBULATORY_CARE_PROVIDER_SITE_OTHER): Payer: BC Managed Care – PPO | Admitting: Student

## 2022-02-08 ENCOUNTER — Other Ambulatory Visit: Payer: Self-pay

## 2022-02-08 ENCOUNTER — Inpatient Hospital Stay (HOSPITAL_COMMUNITY)
Admission: AD | Admit: 2022-02-08 | Discharge: 2022-02-08 | Disposition: A | Discharge status: Home / Self Care | Payer: BC Managed Care – PPO | Attending: Obstetrics & Gynecology | Admitting: Obstetrics & Gynecology

## 2022-02-08 VITALS — BP 118/66 | HR 81 | Wt 166.8 lb

## 2022-02-08 DIAGNOSIS — O368390 Maternal care for abnormalities of the fetal heart rate or rhythm, unspecified trimester, not applicable or unspecified: Secondary | ICD-10-CM

## 2022-02-08 DIAGNOSIS — Z3480 Encounter for supervision of other normal pregnancy, unspecified trimester: Secondary | ICD-10-CM

## 2022-02-08 DIAGNOSIS — Z3483 Encounter for supervision of other normal pregnancy, third trimester: Secondary | ICD-10-CM

## 2022-02-08 DIAGNOSIS — O368330 Maternal care for abnormalities of the fetal heart rate or rhythm, third trimester, not applicable or unspecified: Secondary | ICD-10-CM | POA: Diagnosis not present

## 2022-02-08 DIAGNOSIS — Z3A35 35 weeks gestation of pregnancy: Secondary | ICD-10-CM | POA: Insufficient documentation

## 2022-02-08 DIAGNOSIS — Z87891 Personal history of nicotine dependence: Secondary | ICD-10-CM | POA: Insufficient documentation

## 2022-02-08 DIAGNOSIS — O288 Other abnormal findings on antenatal screening of mother: Secondary | ICD-10-CM

## 2022-02-08 DIAGNOSIS — A749 Chlamydial infection, unspecified: Secondary | ICD-10-CM

## 2022-02-08 MED ORDER — CALCIUM CARBONATE ANTACID 500 MG PO CHEW
400.0000 mg | CHEWABLE_TABLET | Freq: Once | ORAL | Status: AC
  Administered 2022-02-08: 400 mg via ORAL
  Filled 2022-02-08: qty 2

## 2022-02-08 NOTE — Progress Notes (Signed)
Patient presents for ROB visit. No concerns at this time.   

## 2022-02-08 NOTE — Progress Notes (Signed)
   PRENATAL VISIT NOTE  Subjective:  Alexandra Hart is a 31 y.o. G2P1001 at [redacted]w[redacted]d being seen today for ongoing prenatal care.  She is currently monitored for the following issues for this low-risk pregnancy and has SVD (spontaneous vaginal delivery); Post partum depression; Generalized anxiety disorder; Supervision of other normal pregnancy, antepartum; Chlamydia infection; and Umbilical hernia without obstruction and without gangrene on their problem list.  Patient reports heartburn. Last time eating was at 4am overnight at work.  Contractions: Not present. Vag. Bleeding: None.  Movement: Present. Denies leaking of fluid.   The following portions of the patient's history were reviewed and updated as appropriate: allergies, current medications, past family history, past medical history, past social history, past surgical history and problem list.   Objective:   Vitals:   02/08/22 1001  BP: 118/66  Pulse: 81  Weight: 166 lb 12.8 oz (75.7 kg)    Fetal Status: Fetal Heart Rate (bpm): 125   Movement: Present     General:  Alert, oriented and cooperative. Patient is in no acute distress.  Skin: Skin is warm and dry. No rash noted.   Cardiovascular: Normal heart rate noted  Respiratory: Normal respiratory effort, no problems with respiration noted  Abdomen: Soft, gravid, appropriate for gestational age.  Pain/Pressure: Present     Pelvic: Cervical exam deferred        Extremities: Normal range of motion.  Edema: None  Mental Status: Normal mood and affect. Normal behavior. Normal judgment and thought content.   Assessment and Plan:  Pregnancy: G2P1001 at [redacted]w[redacted]d 1. Supervision of other normal pregnancy, antepartum - Low FHR, with non-reactive NST. Dr. Catalina Antigua consulted and recommendation made for patient to present to MAU for additional monitoring. Encouraged patient to hydrate and eat a snack on her ride to the hospital, offered to provide this at clinic.  2. [redacted] weeks gestation of  pregnancy - Swabs at next visit  3. NST (non-stress test) nonreactive - Recommend patient present to hospital for further evaluation.   Preterm labor symptoms and general obstetric precautions including but not limited to vaginal bleeding, contractions, leaking of fluid and fetal movement were reviewed in detail with the patient. Please refer to After Visit Summary for other counseling recommendations.   No follow-ups on file.  Future Appointments  Date Time Provider Department Center  02/15/2022  9:35 AM Raelyn Mora, CNM [facility] None  02/23/2022 10:55 AM Leftwich-Kirby, Wilmer Floor, CNM [facility] None  03/02/2022 10:35 AM Raelyn Mora, CNM [facility] None    Corlis Hove, NP

## 2022-02-08 NOTE — MAU Note (Signed)
Alexandra Hart is a 31 y.o. at [redacted]w[redacted]d here in MAU reporting: sent over from [facility] office for fetal monitoring.  States "his heartbeat was low". Reports +FM.  Denies VB or LOF. LMP: NA Onset of complaint: today Pain score: 0 Vitals:   02/08/22 1156  BP: (!) 130/43  Pulse: 68  Resp: 18  Temp: 98.1 F (36.7 C)  SpO2: 98%     FHT:112 bpm Lab orders placed from triage:   None

## 2022-02-08 NOTE — MAU Provider Note (Signed)
History     [id number]  Arrival date and time: 02/08/22 1135    Chief Complaint  Patient presents with   Fetal Monitoring     HPI Alexandra Hart is a 31 y.o. at 51w5dwith PMHx notable for anxiety, one prior SVD, who presents for NRFHT.   [facility] On doppler there was c/f fetal decels so she was placed on NST which was non-reactive She was sent to MAU for further eval  On my history reports no issues Denies vaginal bleeding, leaking fluid, or contractions Endorses good fetal movement   O/Positive/-- (06/22 1404)  OB History     Gravida  2   Para  1   Term  1   Preterm      AB      Living  1      SAB      IAB      Ectopic      Multiple  1   Live Births  1           Past Medical History:  Diagnosis Date   Generalized anxiety disorder 05/19/2018   Headache    Post partum depression 05/19/2018   Supervision of normal first pregnancy, antepartum 12/27/2016    [place]  [facility]   LMP Blood type: O/Positive/-- (11/19 1007) O pos Genetic Screen 1 Screen: nl NT    AFP:  Negative  Antibody:Negative (11/19 1007)Neg Anatomic [place] Rubella: 4.92 (11/19 1007)Immune GTT Early:               Third trimester: nl 2 hour RPR: Non Reactive (03/11 1115) NR Flu vaccine 01/21/17 HBsAg: Negative (11/19 1007) Neg TDaP vaccine  05/13/17                Vaginal Pap smear, abnormal     Past Surgical History:  Procedure Laterality Date   NO PAST SURGERIES      Family History  Problem Relation Age of Onset   Varicose Veins Mother    M80/ Stillbirths Sister    Asthma Maternal Grandmother    Diabetes Maternal Grandmother    Hypertension Maternal Grandmother    Varicose Veins Maternal Grandmother     Social History   Socioeconomic History   Marital status: Single    Spouse name: Not on file   Number of children: 1   Years of education: Not on file   Highest education level: Not  on file  Occupational History   Not on file  Tobacco Use   Smoking status: Former    Packs/day: 0.50    Types: Cigarettes    Quit date: 11/27/2016    Years since quitting: 5.2   Smokeless tobacco: Never  Vaping Use   Vaping Use: Former  Substance and Sexual Activity   Alcohol use: Not Currently   Drug use: No   Sexual activity: Yes    Partners: Male    Birth control/protection: None  Other Topics Concern   Not on file  Social History Narrative   Not on file   Social Determinants of Health   Financial Resource Strain: Not on file  Food Insecurity: Not on file  Transportation Needs: Not on file  Physical Activity: Not on file  Stress: Not on file  Social Connections: Not on file  Intimate Partner Violence: Not on file    Allergies  Allergen Reactions   Shrimp [Shellfish Allergy] Nausea And Vomiting  Tomato Itching and Rash    No current facility-administered medications on file prior to encounter.   Current Outpatient Medications on File Prior to Encounter  Medication Sig Dispense Refill   Prenatal MV-Min-Fe Fum-FA-DHA (PRENATAL 1 PO) Take 1 tablet by mouth daily.     Blood Pressure Monitoring (BLOOD PRESSURE KIT) DEVI 1 Device by Does not apply route once a week. 1 each 0     ROS Pertinent positives and negative per HPI, all others reviewed and negative  Physical Exam   BP 117/67 (BP Location: Right Arm)   Pulse 70   Temp 97.8 F (36.6 C) (Oral)   Resp 18   Ht _0  (1.549 m)   Wt 76 kg   LMP 06/03/2021   SpO2 100%   BMI 31.67 kg/m   Patient Vitals for the past 24 hrs:  BP Temp Temp src Pulse Resp SpO2 Height Weight  02/08/22 1304 117/67 97.8 F (36.6 C) Oral 70 18 100 % -- --  02/08/22 1215 115/66 -- -- 77 18 -- -- --  02/08/22 1156 (!) 130/43 98.1 F (36.7 C) Oral 68 18 98 % -- --  02/08/22 1151 -- -- -- -- -- -- _1  (1.549 m) 76 kg    Physical Exam Vitals reviewed.  Constitutional:      General: She is not in acute distress.     Appearance: She is well-developed. She is not diaphoretic.  Eyes:     General: No scleral icterus. Pulmonary:     Effort: Pulmonary effort is normal. No respiratory distress.  Abdominal:     General: There is no distension.     Palpations: Abdomen is soft.     Tenderness: There is no abdominal tenderness. There is no guarding or rebound.  Skin:    General: Skin is warm and dry.  Neurological:     Mental Status: She is alert.     Coordination: Coordination normal.      Cervical Exam    Bedside Ultrasound Pt informed that the ultrasound is considered a limited OB ultrasound and is not intended to be a complete ultrasound exam.  Patient also informed that the ultrasound is not being completed with the intent of assessing for fetal or placental anomalies or any pelvic abnormalities.  Explained that the purpose of today's ultrasound is to assess for  presentation.  Patient acknowledges the purpose of the exam and the limitations of the study.    My interpretation: cephalic  FHT Baseline 580, moderate variability, +accels, no decels Toco: rare contractions Cat: I  Labs No results found for this or any previous visit (from the past 24 hour(s)).  Imaging No results found.  MAU Course  Procedures Lab Orders  No laboratory test(s) ordered today   Meds ordered this encounter  Medications   calcium carbonate (TUMS - dosed in mg elemental calcium) chewable tablet 400 mg of elemental calcium   Imaging Orders  No imaging studies ordered today    MDM mild  Assessment and Plan  #NRFHT, resolved #[redacted] weeks gestation of pregnancy Sent from clinic for non reactive NST, tracing here over 40+ minutes is reactive and Cat I, overall very reassuring.   #FWB FHT Cat I NST: reactive   Dispo: discharged to home in stable condition.   Clarnce Flock, MD/MPH 02/08/22 1:08 PM  Allergies as of 02/08/2022       Reactions   Shrimp [shellfish Allergy] Nausea And Vomiting    Tomato Itching, Rash  Medication List     TAKE these medications    Blood Pressure Kit Devi 1 Device by Does not apply route once a week.   PRENATAL 1 PO Take 1 tablet by mouth daily.

## 2022-02-15 ENCOUNTER — Encounter: Payer: Self-pay | Admitting: Obstetrics and Gynecology

## 2022-02-15 ENCOUNTER — Encounter: Payer: Self-pay | Admitting: *Deleted

## 2022-02-15 ENCOUNTER — Other Ambulatory Visit (HOSPITAL_COMMUNITY)
Admission: RE | Admit: 2022-02-15 | Discharge: 2022-02-15 | Disposition: A | Discharge status: Home / Self Care | Payer: BC Managed Care – PPO | Source: Ambulatory Visit | Attending: Obstetrics and Gynecology | Admitting: Obstetrics and Gynecology

## 2022-02-15 ENCOUNTER — Ambulatory Visit (INDEPENDENT_AMBULATORY_CARE_PROVIDER_SITE_OTHER): Payer: BC Managed Care – PPO | Admitting: Obstetrics and Gynecology

## 2022-02-15 VITALS — BP 121/68 | HR 80 | Wt 166.0 lb

## 2022-02-15 DIAGNOSIS — Z3480 Encounter for supervision of other normal pregnancy, unspecified trimester: Secondary | ICD-10-CM | POA: Insufficient documentation

## 2022-02-15 DIAGNOSIS — Z3483 Encounter for supervision of other normal pregnancy, third trimester: Secondary | ICD-10-CM

## 2022-02-15 DIAGNOSIS — B3731 Acute candidiasis of vulva and vagina: Secondary | ICD-10-CM

## 2022-02-15 DIAGNOSIS — Z1332 Encounter for screening for maternal depression: Secondary | ICD-10-CM

## 2022-02-15 DIAGNOSIS — Z3A36 36 weeks gestation of pregnancy: Secondary | ICD-10-CM | POA: Diagnosis not present

## 2022-02-15 NOTE — Progress Notes (Signed)
Pt reports vaginal discharge is thicker than usual. Denies odor, itching.

## 2022-02-15 NOTE — Progress Notes (Signed)
   LOW-RISK PREGNANCY OFFICE VISIT Patient name: Cerenity Goshorn MRN [id number]  Date of birth: [birth year] Chief Complaint:   Routine Prenatal Visit  History of Present Illness:   Alexandra Hart is a 31 y.o. G46P1001 female at [redacted]w[redacted]d with an Estimated Date of Delivery: 03/10/22 being seen today for ongoing management of a low-risk pregnancy.  Today she reports occasional contractions and increased pelvic pressure. Contractions: Not present. Vag. Bleeding: None.  Movement: Present. denies leaking of fluid. Review of Systems:   Pertinent items are noted in HPI Denies abnormal vaginal discharge w/ itching/odor/irritation, headaches, visual changes, shortness of breath, chest pain, abdominal pain, severe nausea/vomiting, or problems with urination or bowel movements unless otherwise stated above. Pertinent History Reviewed:  Reviewed past medical,surgical, social, obstetrical and family history.  Reviewed problem list, medications and allergies. Physical Assessment:   Vitals:   02/15/22 0943  BP: 121/68  Pulse: 80  Weight: 166 lb (75.3 kg)  Body mass index is 31.37 kg/m.        Physical Examination:   General appearance: Well appearing, and in no distress  Mental status: Alert, oriented to person, place, and time  Skin: Warm & dry  Cardiovascular: Normal heart rate noted  Respiratory: Normal respiratory effort, no distress  Abdomen: Soft, gravid, nontender  Pelvic: Cervical exam performed  Dilation: 3 Effacement (%): 80 Station: -2  Extremities: Edema: None  Fetal Status: Fetal Heart Rate (bpm): 132 Fundal Height: 35 cm Movement: Present Presentation: Vertex  No results found for this or any previous visit (from the past 24 hour(s)).  Assessment & Plan:  1) Low-risk pregnancy G2P1001 at [redacted]w[redacted]d with an Estimated Date of Delivery: 03/10/22   2) Supervision of other normal pregnancy, antepartum  - Cervicovaginal ancillary only( [place]),  - Strep Gp B NAA  3) [redacted] weeks gestation of  pregnancy     Meds: No orders of the defined types were placed in this encounter.  Labs/procedures today: GBS, GC/CT and cervical exam  Plan:  Continue routine obstetrical care   Reviewed: Preterm labor symptoms and general obstetric precautions including but not limited to vaginal bleeding, contractions, leaking of fluid and fetal movement were reviewed in detail with the patient.  All questions were answered. Has home bp cuff.  Check bp weekly, let us know if >140/90.   Follow-up: Return in about 1 week (around 02/22/2022) for Return OB visit.  Orders Placed This Encounter  Procedures   Strep Gp B NAA   Raelyn Mora MSN, CNM 02/15/2022 10:12 AM

## 2022-02-16 LAB — CERVICOVAGINAL ANCILLARY ONLY
Bacterial Vaginitis (gardnerella): NEGATIVE
Candida Glabrata: NEGATIVE
Candida Vaginitis: POSITIVE — AB
Chlamydia: NEGATIVE
Comment: NEGATIVE
Comment: NEGATIVE
Comment: NEGATIVE
Comment: NEGATIVE
Comment: NEGATIVE
Comment: NORMAL
Neisseria Gonorrhea: NEGATIVE
Trichomonas: NEGATIVE

## 2022-02-17 LAB — STREP GP B NAA: Strep Gp B NAA: NEGATIVE

## 2022-02-18 ENCOUNTER — Encounter: Payer: Self-pay | Admitting: Obstetrics and Gynecology

## 2022-02-18 MED ORDER — TERCONAZOLE 0.4 % VA CREA: 1.0000 | TOPICAL_CREAM | Freq: Every day | VAGINAL | 0 refills | Status: DC

## 2022-02-18 NOTE — Addendum Note (Signed)
Addended by: Kenard Gower on: 02/18/2022 01:17 AM   Modules accepted: Orders

## 2022-02-23 ENCOUNTER — Encounter: Payer: Self-pay | Admitting: Advanced Practice Midwife

## 2022-02-23 ENCOUNTER — Ambulatory Visit (INDEPENDENT_AMBULATORY_CARE_PROVIDER_SITE_OTHER): Payer: BC Managed Care – PPO | Admitting: Advanced Practice Midwife

## 2022-02-23 VITALS — BP 121/69 | HR 72 | Wt 167.0 lb

## 2022-02-23 DIAGNOSIS — B3731 Acute candidiasis of vulva and vagina: Secondary | ICD-10-CM

## 2022-02-23 DIAGNOSIS — Z3480 Encounter for supervision of other normal pregnancy, unspecified trimester: Secondary | ICD-10-CM

## 2022-02-23 DIAGNOSIS — Z3483 Encounter for supervision of other normal pregnancy, third trimester: Secondary | ICD-10-CM

## 2022-02-23 DIAGNOSIS — Z3A37 37 weeks gestation of pregnancy: Secondary | ICD-10-CM

## 2022-02-23 NOTE — Patient Instructions (Signed)
Labor Precautions Reasons to come to MAU at [facility]:  1.  Contractions are  5 minutes apart or less, each last 1 minute, these have been going on for 1-2 hours, and you cannot walk or talk during them 2.  You have a large gush of fluid, or a trickle of fluid that will not stop and you have to wear a pad 3.  You have bleeding that is bright red, heavier than spotting--like menstrual bleeding (spotting can be normal in early labor or after a check of your cervix) 4.  You do not feel the baby moving like he/she normally does  

## 2022-02-23 NOTE — Progress Notes (Signed)
Patient presents for ROB visit. No concerns at this time.   

## 2022-02-23 NOTE — Progress Notes (Signed)
   PRENATAL VISIT NOTE  Subjective:  Alexandra Hart is a 31 y.o. G2P1001 at [redacted]w[redacted]d being seen today for ongoing prenatal care.  She is currently monitored for the following issues for this low-risk pregnancy and has SVD (spontaneous vaginal delivery); Post partum depression; Generalized anxiety disorder; Supervision of other normal pregnancy, antepartum; Chlamydia infection; and Umbilical hernia without obstruction and without gangrene on their problem list.  Patient reports occasional contractions and passed mucus plug this week .  Contractions: Irritability. Vag. Bleeding: None.  Movement: Present. Denies leaking of fluid.   The following portions of the patient's history were reviewed and updated as appropriate: allergies, current medications, past family history, past medical history, past social history, past surgical history and problem list.   Objective:   Vitals:   02/23/22 1125  BP: 121/69  Pulse: 72  Weight: 167 lb (75.8 kg)    Fetal Status: Fetal Heart Rate (bpm): 129 Fundal Height: 37 cm Movement: Present  Presentation: Vertex  General:  Alert, oriented and cooperative. Patient is in no acute distress.  Skin: Skin is warm and dry. No rash noted.   Cardiovascular: Normal heart rate noted  Respiratory: Normal respiratory effort, no problems with respiration noted  Abdomen: Soft, gravid, appropriate for gestational age.  Pain/Pressure: Present     Pelvic: Cervical exam deferred        Extremities: Normal range of motion.  Edema: None  Mental Status: Normal mood and affect. Normal behavior. Normal judgment and thought content.   Assessment and Plan:  Pregnancy: G2P1001 at [redacted]w[redacted]d 1. Supervision of other normal pregnancy, antepartum --Anticipatory guidance about next visits/weeks of pregnancy given.  --Pt with fast labor last pregnancy, reviewed reasons to come to hospital  - Pt interested in waterbirth and has attended the class.  - Reviewed conditions in labor that will  risk her out of water immersion including thick meconium or blood stained amniotic fluid, non-reassuring fetal status on monitor, excessive bleeding, hypertension, dizziness, use of IV meds, damaged equipment or staffing that does not allow for water immersion, etc.  - The attending midwife must be on the unit for water immersion to begin; pt understands this may delay the start of water immersion. - Reminded pt that signing consent in labor at the hospital also acknowledges they will exit the tub if the attending midwife requests. - Consent will be reviewed and signed at the hospital by the waterbirth provider prior to use of the tub. - Discussed other labor support options if waterbirth becomes unavailable, including position change, freedom of movement, use of birthing ball, and/or use of hydrotherapy in the shower (dependent upon medical condition/provider discretion).    2. Candida vaginitis --resolved  3. [redacted] weeks gestation of pregnancy   Term labor symptoms and general obstetric precautions including but not limited to vaginal bleeding, contractions, leaking of fluid and fetal movement were reviewed in detail with the patient. Please refer to After Visit Summary for other counseling recommendations.   No follow-ups on file.  Future Appointments  Date Time Provider Department Center  03/02/2022 10:35 AM Raelyn Mora, CNM [facility] None    Sharen Counter, CNM

## 2022-03-02 ENCOUNTER — Encounter: Payer: Self-pay | Admitting: Obstetrics and Gynecology

## 2022-03-02 ENCOUNTER — Ambulatory Visit (INDEPENDENT_AMBULATORY_CARE_PROVIDER_SITE_OTHER): Payer: BC Managed Care – PPO | Admitting: Obstetrics and Gynecology

## 2022-03-02 VITALS — BP 124/67 | HR 82 | Wt 167.0 lb

## 2022-03-02 DIAGNOSIS — Z3483 Encounter for supervision of other normal pregnancy, third trimester: Secondary | ICD-10-CM

## 2022-03-02 DIAGNOSIS — Z3480 Encounter for supervision of other normal pregnancy, unspecified trimester: Secondary | ICD-10-CM

## 2022-03-02 DIAGNOSIS — Z3A38 38 weeks gestation of pregnancy: Secondary | ICD-10-CM

## 2022-03-02 NOTE — Progress Notes (Unsigned)
Pt presents for ROB requests to start maternity leave today.

## 2022-03-02 NOTE — Progress Notes (Unsigned)
   LOW-RISK PREGNANCY OFFICE VISIT Patient name: Alexandra Hart MRN [id number]  Date of birth: 1990-05-31 Chief Complaint:   Routine Prenatal Visit  History of Present Illness:   Alexandra Hart is a 31 y.o. G23P1001 female at [redacted]w[redacted]d with an Estimated Date of Delivery: 03/10/22 being seen today for ongoing management of a low-risk pregnancy.  Today she reports {sx:14538}. Contractions: Irritability. Vag. Bleeding: None.  Movement: Present. {Actions; denies-reports:120008} leaking of fluid. Review of Systems:   Pertinent items are noted in HPI Denies abnormal vaginal discharge w/ itching/odor/irritation, headaches, visual changes, shortness of breath, chest pain, abdominal pain, severe nausea/vomiting, or problems with urination or bowel movements unless otherwise stated above. Pertinent History Reviewed:  Reviewed past medical,surgical, social, obstetrical and family history.  Reviewed problem list, medications and allergies. Physical Assessment:   Vitals:   03/02/22 1055  BP: 124/67  Pulse: 82  Weight: 167 lb (75.8 kg)  Body mass index is 31.55 kg/m.        Physical Examination:   General appearance: Well appearing, and in no distress  Mental status: Alert, oriented to person, place, and time  Skin: Warm & dry  Cardiovascular: Normal heart rate noted  Respiratory: Normal respiratory effort, no distress  Abdomen: Soft, gravid, nontender  Pelvic: {Blank single:19197::"Cervical exam performed","Cervical exam deferred"}         Extremities: Edema: None  Fetal Status: Fetal Heart Rate (bpm): 140   Movement: Present    No results found for this or any previous visit (from the past 24 hour(s)).  Assessment & Plan:  1) Low-risk pregnancy G2P1001 at [redacted]w[redacted]d with an Estimated Date of Delivery: 03/10/22   2) ***, ***   Meds: No orders of the defined types were placed in this encounter.  Labs/procedures today: ***  Plan:  Continue routine obstetrical care ***  Reviewed: {Blank  single:19197::"Term","Preterm"} labor symptoms and general obstetric precautions including but not limited to vaginal bleeding, contractions, leaking of fluid and fetal movement were reviewed in detail with the patient.  All questions were answered. *** home bp cuff. Rx faxed to ***. Check bp weekly, let us know if >140/90.   Follow-up: Return in about 1 week (around 03/09/2022) for Return OB visit.  No orders of the defined types were placed in this encounter.  Raelyn Mora MSN, CNM 03/02/2022 11:11 AM

## 2022-03-02 NOTE — Patient Instructions (Signed)
Signs and Symptoms of Labor Labor is the body's natural process of moving the baby and the placenta out of the uterus. The process of labor usually starts when the baby is full-term, between 39 and 41 weeks of pregnancy. Signs and symptoms that you are close to going into labor As your body prepares for labor and the birth of your baby, you may notice the following symptoms in the weeks and days before true labor starts: Passing a small amount of thick, bloody mucus from your vagina. This is called normal bloody show or losing your mucus plug. This may happen more than a week before labor begins, or right before labor begins, as the opening of the cervix starts to widen (dilate). For some women, the entire mucus plug passes at once. For others, pieces of the mucus plug may gradually pass over several days. Your baby moving (dropping) lower in your pelvis to get into position for birth (lightening). When this happens, you may feel more pressure on your bladder and pelvic bone and less pressure on your ribs. This may make it easier to breathe. It may also cause you to need to urinate more often and have problems with bowel movements. Having "practice contractions," also called Braxton Hicks contractions or false labor. These occur at irregular (unevenly spaced) intervals that are more than 10 minutes apart. False labor contractions are common after exercise or sexual activity. They will stop if you change position, rest, or drink fluids. These contractions are usually mild and do not get stronger over time. They may feel like: A backache or back pain. Mild cramps, similar to menstrual cramps. Tightening or pressure in your abdomen. Other early symptoms include: Nausea or loss of appetite. Diarrhea. Having a sudden burst of energy, or feeling very tired. Mood changes. Having trouble sleeping. Signs and symptoms that labor has begun Signs that you are in labor may include: Having contractions that come  at regular (evenly spaced) intervals and increase in intensity. This may feel like more intense tightening or pressure in your abdomen that moves to your back. Contractions may also feel like rhythmic pain in your upper thighs or back that comes and goes at regular intervals. If you are delivering for the first time, this change in intensity of contractions often occurs at a more gradual pace. If you have given birth before, you may notice a more rapid progression of contraction changes. Feeling pressure in the vaginal area. Your water breaking (rupture of membranes). This is when the sac of fluid that surrounds your baby breaks. Fluid leaking from your vagina may be clear or blood-tinged. Labor usually starts within 24 hours of your water breaking, but it may take longer to begin. Some people may feel a sudden gush of fluid; others may notice repeatedly damp underwear. Follow these instructions at home:  When labor starts, or if your water breaks, call your health care provider or nurse care line. Based on your situation, they will determine when you should go in for an exam. During early labor, you may be able to rest and manage symptoms at home. Some strategies to try at home include: Breathing and relaxation techniques. Taking a warm bath or shower. Listening to music. Using a heating pad on the lower back for pain. If directed, apply heat to the area as often as told by your health care provider. Use the heat source that your health care provider recommends, such as a moist heat pack or a heating pad. Place a   towel between your skin and the heat source. Leave the heat on for 20-30 minutes. Remove the heat if your skin turns bright red. This is especially important if you are unable to feel pain, heat, or cold. You have a greater risk of getting burned. Contact a health care provider if: Your labor has started. Your water breaks. You have nausea, vomiting, or diarrhea. Get help right away  if: You have painful, regular contractions that are 5 minutes apart or less. Labor starts before you are [redacted] weeks along in your pregnancy. You have a fever. You have bright red blood coming from your vagina. You do not feel your baby moving. You have a severe headache with or without vision problems. You have chest pain or shortness of breath. These symptoms may represent a serious problem that is an emergency. Do not wait to see if the symptoms will go away. Get medical help right away. Call your local emergency services (911 in the [place] Do not drive yourself to the hospital. Summary Labor is your body's natural process of moving your baby and the placenta out of your uterus. The process of labor usually starts when your baby is full-term, between 39 and 40 weeks of pregnancy. When labor starts, or if your water breaks, call your health care provider or nurse care line. Based on your situation, they will determine when you should go in for an exam. This information is not intended to replace advice given to you by your health care provider. Make sure you discuss any questions you have with your health care provider. Document Revised: 07/05/2020 Document Reviewed: 07/05/2020 Elsevier Patient Education  2023 [employer] Inc[employer]  

## 2022-03-05 DIAGNOSIS — Z419 Encounter for procedure for purposes other than remedying health state, unspecified: Secondary | ICD-10-CM | POA: Diagnosis not present

## 2022-03-05 NOTE — L&D Delivery Note (Signed)
OB/GYN Faculty Practice Delivery Note  Weslyn Holsonback is a 32 y.o. G2P1001 s/p SVB at [redacted]w[redacted]d. She was admitted for active labor/transtion.   ROM: 2h 48m with clear fluid GBS Status: neg Maximum Maternal Temperature: (not taken prior to delivery)  Labor Progress: Ms Stoneking arrived in active labor at [place], desirous of a waterbirth; due to the inability to safely determine the reactivity of FHR, she was unable to get into the tub prior to progressing to complete and pushing with a couple ctx to SVB.  Delivery Date/Time: January 2nd, 2023 at 0301 Delivery: Called to room and patient was complete and pushing. Head delivered LOA. No nuchal cord present. Shoulder and body delivered in usual fashion. Infant with spontaneous cry, placed on mother's abdomen, dried and stimulated. Cord clamped x 2 after pulsing had stopped and placenta had delivered with gentle cord traction, and cut by FOB. Cord blood drawn. Fundus firm with massage and Pitocin. Labia, perineum, vagina, and cervix inspected and found to be intact.   Placenta: spont, intact; to L&D Complications: none Lacerations: none EBL: 78cc Analgesia: none  Postpartum Planning [x]  message to sent to schedule follow-up   Infant: boy  APGARs 8/9  2970g (6lb 8.8oz)  Myrtis Ser, CNM  03/06/2022 3:42 AM

## 2022-03-06 ENCOUNTER — Encounter (HOSPITAL_COMMUNITY): Payer: Self-pay | Admitting: Family Medicine

## 2022-03-06 ENCOUNTER — Inpatient Hospital Stay (HOSPITAL_COMMUNITY)
Admission: AD | Admit: 2022-03-06 | Discharge: 2022-03-07 | DRG: 807 | Disposition: A | Discharge status: Home / Self Care | Payer: BC Managed Care – PPO | Attending: Obstetrics and Gynecology | Admitting: Obstetrics and Gynecology

## 2022-03-06 ENCOUNTER — Other Ambulatory Visit: Payer: Self-pay

## 2022-03-06 DIAGNOSIS — O9962 Diseases of the digestive system complicating childbirth: Principal | ICD-10-CM | POA: Diagnosis present

## 2022-03-06 DIAGNOSIS — Z3A39 39 weeks gestation of pregnancy: Secondary | ICD-10-CM

## 2022-03-06 DIAGNOSIS — Z87891 Personal history of nicotine dependence: Secondary | ICD-10-CM | POA: Diagnosis not present

## 2022-03-06 DIAGNOSIS — O26893 Other specified pregnancy related conditions, third trimester: Secondary | ICD-10-CM | POA: Diagnosis not present

## 2022-03-06 DIAGNOSIS — Z3480 Encounter for supervision of other normal pregnancy, unspecified trimester: Principal | ICD-10-CM

## 2022-03-06 DIAGNOSIS — K429 Umbilical hernia without obstruction or gangrene: Secondary | ICD-10-CM | POA: Diagnosis present

## 2022-03-06 DIAGNOSIS — F411 Generalized anxiety disorder: Secondary | ICD-10-CM | POA: Diagnosis present

## 2022-03-06 DIAGNOSIS — A749 Chlamydial infection, unspecified: Secondary | ICD-10-CM

## 2022-03-06 LAB — CBC
HCT: 37.3 % (ref 36.0–46.0)
Hemoglobin: 13.4 g/dL (ref 12.0–15.0)
MCH: 29.9 pg (ref 26.0–34.0)
MCHC: 35.9 g/dL (ref 30.0–36.0)
MCV: 83.3 fL (ref 80.0–100.0)
Platelets: 233 10*3/uL (ref 150–400)
RBC: 4.48 MIL/uL (ref 3.87–5.11)
RDW: 13.3 % (ref 11.5–15.5)
WBC: 7.2 10*3/uL (ref 4.0–10.5)
nRBC: 0 % (ref 0.0–0.2)

## 2022-03-06 LAB — TYPE AND SCREEN
ABO/RH(D): O POS
Antibody Screen: NEGATIVE

## 2022-03-06 LAB — HIV ANTIBODY (ROUTINE TESTING W REFLEX): HIV Screen 4th Generation wRfx: NONREACTIVE

## 2022-03-06 LAB — RPR: RPR Ser Ql: NONREACTIVE

## 2022-03-06 MED ORDER — LACTATED RINGERS IV SOLN: 500.0000 mL | INTRAVENOUS | Status: DC | PRN

## 2022-03-06 MED ORDER — SIMETHICONE 80 MG PO CHEW: 80.0000 mg | CHEWABLE_TABLET | ORAL | Status: DC | PRN

## 2022-03-06 MED ORDER — DIPHENHYDRAMINE HCL 25 MG PO CAPS: 25.0000 mg | ORAL_CAPSULE | Freq: Four times a day (QID) | ORAL | Status: DC | PRN

## 2022-03-06 MED ORDER — PRENATAL MULTIVITAMIN CH
1.0000 | ORAL_TABLET | Freq: Every day | ORAL | Status: DC
  Administered 2022-03-06: 1 via ORAL
  Filled 2022-03-06: qty 1

## 2022-03-06 MED ORDER — FLEET ENEMA 7-19 GM/118ML RE ENEM: 1.0000 | ENEMA | RECTAL | Status: DC | PRN

## 2022-03-06 MED ORDER — ERYTHROMYCIN 5 MG/GM OP OINT
TOPICAL_OINTMENT | OPHTHALMIC | Status: AC
  Filled 2022-03-06: qty 1

## 2022-03-06 MED ORDER — IBUPROFEN 600 MG PO TABS
600.0000 mg | ORAL_TABLET | Freq: Four times a day (QID) | ORAL | Status: DC
  Administered 2022-03-06 – 2022-03-07 (×5): 600 mg via ORAL
  Filled 2022-03-06 (×5): qty 1

## 2022-03-06 MED ORDER — ACETAMINOPHEN 325 MG PO TABS
650.0000 mg | ORAL_TABLET | ORAL | Status: DC | PRN
  Administered 2022-03-06: 650 mg via ORAL
  Filled 2022-03-06: qty 2

## 2022-03-06 MED ORDER — ACETAMINOPHEN 325 MG PO TABS: 650.0000 mg | ORAL_TABLET | ORAL | Status: DC | PRN

## 2022-03-06 MED ORDER — ONDANSETRON HCL 4 MG/2ML IJ SOLN: 4.0000 mg | INTRAMUSCULAR | Status: DC | PRN

## 2022-03-06 MED ORDER — SENNOSIDES-DOCUSATE SODIUM 8.6-50 MG PO TABS
2.0000 | ORAL_TABLET | ORAL | Status: DC
  Administered 2022-03-06: 2 via ORAL
  Filled 2022-03-06 (×2): qty 2

## 2022-03-06 MED ORDER — COCONUT OIL OIL: 1.0000 | TOPICAL_OIL | Status: DC | PRN

## 2022-03-06 MED ORDER — DIBUCAINE (PERIANAL) 1 % EX OINT
1.0000 | TOPICAL_OINTMENT | CUTANEOUS | Status: DC | PRN
  Filled 2022-03-06: qty 28

## 2022-03-06 MED ORDER — OXYTOCIN BOLUS FROM INFUSION: 333.0000 mL | Freq: Once | INTRAVENOUS | Status: DC

## 2022-03-06 MED ORDER — ONDANSETRON HCL 4 MG/2ML IJ SOLN: 4.0000 mg | Freq: Four times a day (QID) | INTRAMUSCULAR | Status: DC | PRN

## 2022-03-06 MED ORDER — LIDOCAINE HCL (PF) 1 % IJ SOLN: 30.0000 mL | INTRAMUSCULAR | Status: DC | PRN

## 2022-03-06 MED ORDER — MEASLES, MUMPS & RUBELLA VAC IJ SOLR: 0.5000 mL | Freq: Once | INTRAMUSCULAR | Status: DC

## 2022-03-06 MED ORDER — OXYCODONE-ACETAMINOPHEN 5-325 MG PO TABS: 1.0000 | ORAL_TABLET | ORAL | Status: DC | PRN

## 2022-03-06 MED ORDER — SOD CITRATE-CITRIC ACID 500-334 MG/5ML PO SOLN: 30.0000 mL | ORAL | Status: DC | PRN

## 2022-03-06 MED ORDER — OXYTOCIN 10 UNIT/ML IJ SOLN
INTRAMUSCULAR | Status: AC
  Filled 2022-03-06: qty 2

## 2022-03-06 MED ORDER — BENZOCAINE-MENTHOL 20-0.5 % EX AERO
1.0000 | INHALATION_SPRAY | CUTANEOUS | Status: DC | PRN
  Administered 2022-03-06: 1 via TOPICAL
  Filled 2022-03-06: qty 56

## 2022-03-06 MED ORDER — OXYCODONE-ACETAMINOPHEN 5-325 MG PO TABS: 2.0000 | ORAL_TABLET | ORAL | Status: DC | PRN

## 2022-03-06 MED ORDER — ONDANSETRON HCL 4 MG PO TABS: 4.0000 mg | ORAL_TABLET | ORAL | Status: DC | PRN

## 2022-03-06 MED ORDER — ZOLPIDEM TARTRATE 5 MG PO TABS: 5.0000 mg | ORAL_TABLET | Freq: Every evening | ORAL | Status: DC | PRN

## 2022-03-06 MED ORDER — OXYCODONE HCL 5 MG PO TABS: 5.0000 mg | ORAL_TABLET | ORAL | Status: DC | PRN

## 2022-03-06 MED ORDER — WITCH HAZEL-GLYCERIN EX PADS
1.0000 | MEDICATED_PAD | CUTANEOUS | Status: DC | PRN
  Administered 2022-03-06: 1 via TOPICAL

## 2022-03-06 MED ORDER — LACTATED RINGERS IV SOLN: INTRAVENOUS | Status: DC

## 2022-03-06 MED ORDER — OXYTOCIN-SODIUM CHLORIDE 30-0.9 UT/500ML-% IV SOLN: 2.5000 [IU]/h | INTRAVENOUS | Status: DC

## 2022-03-06 MED ORDER — TETANUS-DIPHTH-ACELL PERTUSSIS 5-2.5-18.5 LF-MCG/0.5 IM SUSY: 0.5000 mL | PREFILLED_SYRINGE | Freq: Once | INTRAMUSCULAR | Status: DC

## 2022-03-06 NOTE — Lactation Note (Signed)
This note was copied from a baby's chart. Lactation Consultation Note  Patient Name: Alexandra Hart WIOXB'[id number] Date: 03/06/2022   Age:30 hours   LC Note:  Attempted to visit with family, however, there is a "quiet" sign on door; will follow up later today.   Maternal Data    Feeding    LATCH Score                    Lactation Tools Discussed/Used    Interventions    Discharge    Consult Status      Azam Gervasi R Miyu Fenderson 03/06/2022, 8:14 AM

## 2022-03-06 NOTE — H&P (Signed)
Alexandra Hart is a 32 y.o. G2P1001 female at 72w3dby LMP c/w 19wk scan presenting in active labor/transition.   Reports active fetal movement, contractions: regular, every 2-4 minutes, vaginal bleeding: spotting, membranes: intact, ruptured.  Initiated prenatal care at [facility] at 11.6 wks.   Most recent u/s : 136w6dEFW 65%, nl fluid, post placenta.   This pregnancy complicated by: # umbilical hernia # anxiety  Prenatal History/Complications:  # SVB 202025 hx postpartum depression  Past Medical History: Past Medical History:  Diagnosis Date   Generalized anxiety disorder 05/19/2018   Headache    Post partum depression 05/19/2018   Supervision of normal first pregnancy, antepartum 12/27/2016    Clinic  [facility] Dating   LMP Blood type: O/Positive/-- (11/19 1007) O pos Genetic Screen 1 Screen: nl NT    AFP:  Negative  Antibody:Negative (11/19 1007)Neg Anatomic USKoreaormal Rubella: 4.92 (11/19 1007)Immune GTT Early:               Third trimester: nl 2 hour RPR: Non Reactive (03/11 1115) NR Flu vaccine 01/21/17 HBsAg: Negative (11/19 1007) Neg TDaP vaccine  05/13/17                Vaginal Pap smear, abnormal     Past Surgical History: Past Surgical History:  Procedure Laterality Date   NO PAST SURGERIES      Obstetrical History: OB History     Gravida  2   Para  1   Term  1   Preterm      AB      Living  1      SAB      IAB      Ectopic      Multiple  1   Live Births  1           Social History: Social History   Socioeconomic History   Marital status: Single    Spouse name: Not on file   Number of children: 1   Years of education: Not on file   Highest education level: Not on file  Occupational History   Not on file  Tobacco Use   Smoking status: Former    Packs/day: 0.50    Types: Cigarettes    Quit date: 11/27/2016    Years since quitting: 5.2   Smokeless tobacco: Never  Vaping Use   Vaping Use: Former  Substance and  Sexual Activity   Alcohol use: Not Currently   Drug use: No   Sexual activity: Yes    Partners: Male    Birth control/protection: None  Other Topics Concern   Not on file  Social History Narrative   Not on file   Social Determinants of Health   Financial Resource Strain: Not on file  Food Insecurity: No Food Insecurity (03/06/2022)   Hunger Vital Sign    Worried About Running Out of Food in the Last Year: Never true    Ran Out of Food in the Last Year: Never true  Transportation Needs: No Transportation Needs (03/06/2022)   PRAPARE - TrHydrologistMedical): No    Lack of Transportation (Non-Medical): No  Physical Activity: Not on file  Stress: Not on file  Social Connections: Not on file    Family History: Family History  Problem Relation Age of Onset   Varicose Veins Mother    Miscarriages / Stillbirths Sister    Asthma Maternal Grandmother    Diabetes Maternal  Grandmother    Hypertension Maternal Grandmother    Varicose Veins Maternal Grandmother     Allergies: Allergies  Allergen Reactions   Shrimp [Shellfish Allergy] Nausea And Vomiting   Tomato Itching and Rash    Medications Prior to Admission  Medication Sig Dispense Refill Last Dose   Blood Pressure Monitoring (BLOOD PRESSURE KIT) DEVI 1 Device by Does not apply route once a week. 1 each 0    Prenatal MV-Min-Fe Fum-FA-DHA (PRENATAL 1 PO) Take 1 tablet by mouth daily.       Review of Systems  Pertinent pos/neg as indicated in HPI  Blood pressure (!) 96/54, pulse 72, resp. rate 20, last menstrual period 06/03/2021, unknown if currently breastfeeding. General appearance: alert, cooperative, and mild distress Lungs: clear to auscultation bilaterally Heart: regular rate and rhythm Abdomen: gravid, soft, non-tender, EFW by Leopold's approximately 6lbs Extremities: tr edema  Fetal monitoring: FHR: 115-120s bpm, variability: moderate,  Accelerations: 10x10  ,   decelerations/variables:  Present  w ctx Uterine activity: Frequency: Every 3 minutes Dilation: 10 Effacement (%): 100 Station: Plus 2 Exam by:: K.Tedrick Port,CNM Presentation: cephalic   Prenatal labs: ABO, Rh: --/--/PENDING (01/02 0251) Antibody: PENDING (01/02 0251) Rubella: 4.70 (06/22 1404) RPR: Non Reactive (10/12 1156)  HBsAg: Negative (06/22 1404)  HIV: Non Reactive (10/12 1156)  GBS: Negative/-- (12/14 1015)  2hr GTT: 69/160/140  Prenatal Transfer Tool  Maternal Diabetes: No Genetic Screening: Normal Maternal Ultrasounds/Referrals: Normal Fetal Ultrasounds or other Referrals:  None Maternal Substance Abuse:  No Significant Maternal Medications:  None Significant Maternal Lab Results: Group B Strep negative  Results for orders placed or performed during the hospital encounter of 03/06/22 (from the past 24 hour(s))  Type and screen [place]   Collection Time: 03/06/22  2:51 AM  Result Value Ref Range   ABO/RH(D) PENDING    Antibody Screen PENDING    Sample Expiration      03/09/2022,2359 Performed at [facility], [place] [address] [place], [place] [address]   CBC   Collection Time: 03/06/22  2:56 AM  Result Value Ref Range   WBC 7.2 4.0 - 10.5 K/uL   RBC 4.48 3.87 - 5.11 MIL/uL   Hemoglobin 13.4 12.0 - 15.0 g/dL   HCT 37.3 36.0 - 46.0 %   MCV 83.3 80.0 - 100.0 fL   MCH 29.9 26.0 - 34.0 pg   MCHC 35.9 30.0 - 36.0 g/dL   RDW 13.3 11.5 - 15.5 %   Platelets 233 150 - 400 K/uL   nRBC 0.0 0.0 - 0.2 %     Assessment:  32w3dSIUP  G2P1001  Active labor/transition  Cat 2 FHR  GBS Negative/-- (12/14 1015)  Plan:  Admit to L&D  IV pain meds/epidural prn active labor  Expectant management  Anticipate vag del   Plans to breastfeed  Contraception: POPs  Circumcision: yes  KMyrtis SerCNM 03/06/2022, 3:31 AM

## 2022-03-06 NOTE — MAU Note (Signed)
Called to lobby d/t intensity of contractions. Pt reports leaking of fluid since midnight.  +FM.   Grossly ruptured clear fluid.  SVE 9.5cm by Autry-lott, DO. Admission orders received. Pt declines IV.  Called LD for room assignment.   Pt transported to LD accompanied by RN's and provider.

## 2022-03-06 NOTE — Discharge Summary (Signed)
Postpartum Discharge Summary  Date of Service updated***     Patient Name: Alexandra Hart DOB: 1990/12/08 MRN: [id number]  Date of admission: 03/06/2022 Delivery date:03/06/2022  Delivering provider: Serita Grammes D  Date of discharge: 03/06/2022  Admitting diagnosis: Normal labor and delivery [O80] Intrauterine pregnancy: [redacted]w[redacted]d    Secondary diagnosis:  Principal Problem:   Normal labor and delivery Active Problems:   Generalized anxiety disorder   Umbilical hernia without obstruction and without gangrene  Additional problems: none    Discharge diagnosis: Term Pregnancy Delivered                                              Post partum procedures:{Postpartum procedures:23558} Augmentation:  none Complications: None  Hospital course: Onset of Labor With Vaginal Delivery      32y.o. yo G2P1001 at 369w3das admitted in Active Labor on 03/06/2022. Labor course was uncomplicated, however it was significant for her not being able to get into the tub due to FHR variables and difficulty at times to distinguish fetal from maternal heartrate.  Membrane Rupture Time/Date: 12:30 AM ,03/06/2022   Delivery Method:Vaginal, Spontaneous  Episiotomy: None  Lacerations:  None  Patient had a postpartum course complicated by ***.  She is ambulating, tolerating a regular diet, passing flatus, and urinating well. Patient is discharged home in stable condition on 03/06/22.  Newborn Data: Birth date:03/06/2022  Birth time:3:01 AM  Gender:Female  Living status:Living  Apgars:8 ,9  Weight:2970 g (6lb 8.8oz)  Magnesium Sulfate received: No BMZ received: No Rhophylac:N/A MMR:N/A T-DaP: declined prenatally Flu: No Transfusion:No  Physical exam  Vitals:   03/06/22 0330 03/06/22 0345 03/06/22 0410 03/06/22 0459  BP: 119/75 136/81 127/80 118/62  Pulse: 82 74 72 65  Resp: _0 Temp:   98.2 F (36.8 C) 98.3 F (36.8 C)  TempSrc:   Oral Oral  SpO2:    98%   General: {Exam;  general:21111117} Lochia: {Desc; appropriate/inappropriate:30686::"appropriate"} Uterine Fundus: {Desc; firm/soft:30687} Incision: {Exam; incision:21111123} DVT Evaluation: {Exam; dvt:2111122} Labs: Lab Results  Component Value Date   WBC 7.2 03/06/2022   HGB 13.4 03/06/2022   HCT 37.3 03/06/2022   MCV 83.3 03/06/2022   PLT 233 03/06/2022       No data to display         Edinburgh Score:    09/09/2017    8:52 AM  Edinburgh Postnatal Depression Scale Screening Tool  I have been able to laugh and see the funny side of things. 0  I have looked forward with enjoyment to things. 0  I have blamed myself unnecessarily when things went wrong. 0  I have been anxious or worried for no good reason. 0  I have felt scared or panicky for no good reason. 0  Things have been getting on top of me. 0  I have been so unhappy that I have had difficulty sleeping. 0  I have felt sad or miserable. 0  I have been so unhappy that I have been crying. 0  The thought of harming myself has occurred to me. 0  Edinburgh Postnatal Depression Scale Total 0     After visit meds:  Allergies as of 03/06/2022       Reactions   Shrimp [shellfish Allergy] Nausea And Vomiting   Tomato Itching, Rash     Med Rec  must be completed prior to using this [facility]***        Discharge home in stable condition Infant Feeding: {Baby feeding:23562} Infant Disposition:{CHL IP OB HOME WITH DIXVEZ:50158} Discharge instruction: per After Visit Summary and Postpartum booklet. Activity: Advance as tolerated. Pelvic rest for 6 weeks.  Diet: routine diet Future Appointments: Future Appointments  Date Time Provider [place]  03/08/2022  1:50 PM Renee Harder, CNM [place] None   Follow up Visit:  Myrtis Ser, CNM  P [facility] Please schedule this patient for Postpartum visit in: 6 weeks with the following provider: Any provider In-Person For C/S patients schedule nurse incision check  in weeks 2 weeks: no Low risk pregnancy complicated by: none Delivery mode:  SVD Anticipated Birth Control:  POPs PP Procedures needed: 2wk mood check (hx PP depression) Schedule Integrated BH visit: no   03/06/2022 Myrtis Ser, CNM

## 2022-03-06 NOTE — Lactation Note (Signed)
This note was copied from a baby's chart. Lactation Consultation Note  Patient Name: Boy Kendre Sires QQPYP'P Date: 03/06/2022 Reason for consult: Initial assessment Age:30 hours  P2, Experienced w/ breastfeeding.  Mother states she attempted breastfeeding at 29 and baby would not latch so she gave baby colostrum.  Attempted latching with LC and baby would not open to feed.  Changed large void/stool diaper and baby started to gag.   LC burped and suctioned baby and his lips became blue when LC called for assistance.  Infant's color returned to pink with assistance in room suctioning baby.  Mother had hand expressed 5 ml into spoon.  Provided colostrum container.  Suggest waiting a short period before attempting to feed him again since he has been spitty per mother.  Mother was shown how to suction baby by Rolanda Lundborg.  Maternal Data Has patient been taught Hand Expression?: Yes Does the patient have breastfeeding experience prior to this delivery?: Yes How long did the patient breastfeed?: 1.5 hours  Feeding Mother's Current Feeding Choice: Breast Milk   Interventions Interventions: Breast feeding basics reviewed;Assisted with latch;Skin to skin;Hand express;Position options;Education;LC Services brochure  Discharge Pump: Personal;Hands Free  Consult Status Consult Status: Follow-up Date: 03/07/22 Follow-up type: In-patient    Vivianne Master [facility] 03/06/2022, 1:44 PM

## 2022-03-07 NOTE — Progress Notes (Signed)
POSTPARTUM PROGRESS NOTE  Post Partum Day #1  Subjective:  Alexandra Hart is a 32 y.o. G2P2002 s/p SVD at [redacted]w[redacted]d.  No acute events overnight.  Pt denies problems with ambulating, voiding or po intake.  She denies nausea or vomiting.  Pain is well controlled.  She has had flatus. She has not had bowel movement.  Lochia Minimal.   Objective: Blood pressure (!) 109/47, pulse 80, temperature 98.1 F (36.7 C), temperature source Oral, resp. rate 17, last menstrual period 06/03/2021, SpO2 100 %, unknown if currently breastfeeding.  Physical Exam:  General: alert, cooperative and no distress Chest: no respiratory distress Heart:regular rate, distal pulses intact Abdomen: soft, nontender,  Uterine Fundus: firm, appropriately tender DVT Evaluation: No calf swelling or tenderness Extremities: No lower extremity edema Skin: warm, dry;   Recent Labs    03/06/22 0256  HGB 13.4  HCT 37.3    Assessment/Plan: Alexandra Hart is a 32 y.o. B1D1761 s/p SVD at [redacted]w[redacted]d   PPD#1 - Doing well Contraception: Undecided  Feeding: Breastfeeding only Dispo: Plan for discharge at 24 hrs.   LOS: 1 day   Clydell Hakim, Medical Student, CNM 03/07/2022, 7:50 AM

## 2022-03-07 NOTE — Social Work (Signed)
CSW received consult for hx of Anxiety and Postpartum Depression.  CSW met with MOB to offer support and complete assessment. CSW entered the room introduced self, CSW role and reason for visit, MOB was agreeable to visit and allowed FOB to remain in the room during the visit. CSW inquired about how MOB was feeling, MOB reported good.   CSW inquired about MOB MH hx, MOB reported she experienced PPD wit her first child, CSW inquired about MOB's symptoms MOB reported she had a hard time leaving her baby. MOB reported she anxiety is more situational and reported a stable mood throughout pregnancy. CSW inquired abut treatment, MOB reported she was on medication in the past but did not like the way it made her feel. CSW assessed for safety, MOB denied any SI or HI. CSW provided education regarding the baby blues period vs. perinatal mood disorders, discussed treatment and gave resources for mental health follow up if concerns arise.  CSW recommends self-evaluation during the postpartum time period using the New Mom Checklist from Postpartum Progress and encouraged MOB to contact a medical professional if symptoms are noted at any time.  MOB reported having good support from FOB, her mom and sisters.   CSW provided review of Sudden Infant Death Syndrome (SIDS) precautions.  MOB identified [facility] for infants follow up care. MOB reported they have all necessary items for the infant including a crib and bassinet for him to sleep. CSW identifies no further need for intervention and no barriers to discharge at this time.  Letta Kocher, [place] Social Worker [phone]

## 2022-03-08 ENCOUNTER — Ambulatory Visit: Payer: Self-pay

## 2022-03-08 LAB — BIRTH TISSUE RECOVERY COLLECTION (PLACENTA DONATION)

## 2022-03-08 NOTE — Lactation Note (Signed)
This note was copied from a baby's chart. Lactation Consultation Note  Patient Name: Boy Shakara Tweedy QQIWL'N Date: 03/08/2022 Reason for consult: Follow-up assessment Age:30 hours  P2, Baby has been feeding well.  Reviewed engorgement care and monitoring voids/stools.   Maternal Data Has patient been taught Hand Expression?: Yes  Feeding Mother's Current Feeding Choice: Breast Milk  Interventions Interventions: Education  Discharge Discharge Education: Engorgement and breast care;Warning signs for feeding baby  Consult Status Consult Status: Complete Date: 03/08/22    Vivianne Master [facility] 03/08/2022, 9:37 AM

## 2022-03-13 ENCOUNTER — Telehealth (HOSPITAL_COMMUNITY): Payer: Self-pay | Admitting: *Deleted

## 2022-03-13 NOTE — Telephone Encounter (Signed)
Patient voiced no questions or concerns regarding her health at this time. EPDS=1. Patient voiced no questions or concerns regarding infant at this time. Patient reports infant sleeps in a crib on his back. RN reviewed ABCs of safe sleep. Patient verbalized understanding. Patient requested RN email information on hospital's breastfeeding and pumping support groups. Email sent. Erline Levine, RN, 03/13/22, [phone]

## 2022-03-20 ENCOUNTER — Encounter: Payer: Medicaid Other | Admitting: Licensed Clinical Social Worker

## 2022-04-05 DIAGNOSIS — Z419 Encounter for procedure for purposes other than remedying health state, unspecified: Secondary | ICD-10-CM | POA: Diagnosis not present

## 2022-04-06 ENCOUNTER — Encounter (HOSPITAL_BASED_OUTPATIENT_CLINIC_OR_DEPARTMENT_OTHER): Payer: Self-pay

## 2022-04-06 ENCOUNTER — Other Ambulatory Visit: Payer: Self-pay

## 2022-04-06 ENCOUNTER — Emergency Department (HOSPITAL_BASED_OUTPATIENT_CLINIC_OR_DEPARTMENT_OTHER)
Admission: EM | Admit: 2022-04-06 | Discharge: 2022-04-06 | Disposition: A | Discharge status: Home / Self Care | Payer: BC Managed Care – PPO | Attending: Emergency Medicine | Admitting: Emergency Medicine

## 2022-04-06 DIAGNOSIS — Z20822 Contact with and (suspected) exposure to covid-19: Secondary | ICD-10-CM | POA: Diagnosis not present

## 2022-04-06 DIAGNOSIS — J029 Acute pharyngitis, unspecified: Secondary | ICD-10-CM | POA: Diagnosis not present

## 2022-04-06 LAB — RESP PANEL BY RT-PCR (RSV, FLU A&B, COVID)  RVPGX2
Influenza A by PCR: NEGATIVE
Influenza B by PCR: NEGATIVE
Resp Syncytial Virus by PCR: NEGATIVE
SARS Coronavirus 2 by RT PCR: NEGATIVE

## 2022-04-06 LAB — GROUP A STREP BY PCR: Group A Strep by PCR: NOT DETECTED

## 2022-04-06 NOTE — ED Triage Notes (Signed)
Pt c/o sore throat starting last night.  Pain score 2/10.  Denies sick contacts.

## 2022-04-06 NOTE — ED Provider Notes (Signed)
[facility] Provider Note   CSN: [id number] Arrival date & time: 04/06/22  1212     History  Chief Complaint  Patient presents with   Sore Throat    Alexandra Hart is a 32 y.o. female no significant past medical history presenting today for evaluation of sore throat.  Patient reports that she started to have sore throat last night.  She denies any cough, nasal congestion, runny nose, fever.  No known sick contact.  Denies nausea, vomiting, chest pain, shortness of breath.  Denies difficulty speaking or swallowing.   Sore Throat    Past Medical History:  Diagnosis Date   Generalized anxiety disorder 05/19/2018   Headache    Post partum depression 05/19/2018   Supervision of normal first pregnancy, antepartum 12/27/2016    Clinic  [facility] Prenatal Labs Dating   LMP Blood type: O/Positive/-- (11/19 1007) O pos Genetic Screen 1 Screen: nl NT    AFP:  Negative  Antibody:Negative (11/19 1007)Neg Anatomic [place] normal Rubella: 4.92 (11/19 1007)Immune GTT Early:               Third trimester: nl 2 hour RPR: Non Reactive (03/11 1115) NR Flu vaccine 01/21/17 HBsAg: Negative (11/19 1007) Neg TDaP vaccine  05/13/17                Vaginal Pap smear, abnormal    Past Surgical History:  Procedure Laterality Date   NO PAST SURGERIES       Home Medications Prior to Admission medications   Medication Sig Start Date End Date Taking? Authorizing Provider  Blood Pressure Monitoring (BLOOD PRESSURE KIT) DEVI 1 Device by Does not apply route once a week. 08/16/21   Chancy Milroy, MD  Prenatal MV-Min-Fe Fum-FA-DHA (PRENATAL 1 PO) Take 1 tablet by mouth daily.    [provider]      Allergies    Shrimp [shellfish allergy] and Tomato    Review of Systems   Review of Systems Negative except as per HPI.  Physical Exam Updated Vital Signs BP 120/83   Pulse 66   Temp 98.5 F (36.9 C)   Resp 16   Ht 5' (1.524 m)   Wt 72.6 kg   LMP  06/03/2021   SpO2 100%   BMI 31.25 kg/m  Physical Exam Vitals and nursing note reviewed.  Constitutional:      Appearance: Normal appearance.  HENT:     Head: Normocephalic and atraumatic.     Mouth/Throat:     Mouth: Mucous membranes are moist. No oral lesions.     Pharynx: Uvula midline. No oropharyngeal exudate or uvula swelling.     Comments: Posterior oropharyngeal erythema. Eyes:     General: No scleral icterus. Cardiovascular:     Rate and Rhythm: Normal rate and regular rhythm.     Pulses: Normal pulses.     Heart sounds: Normal heart sounds.  Pulmonary:     Effort: Pulmonary effort is normal.     Breath sounds: Normal breath sounds.  Abdominal:     General: Abdomen is flat.     Palpations: Abdomen is soft.     Tenderness: There is no abdominal tenderness.  Musculoskeletal:        General: No deformity.  Skin:    General: Skin is warm.     Findings: No rash.  Neurological:     General: No focal deficit present.     Mental Status: She is alert.  Psychiatric:  Mood and Affect: Mood normal.     ED Results / Procedures / Treatments   Labs (all labs ordered are listed, but only abnormal results are displayed) Labs Reviewed  GROUP A STREP BY PCR  RESP PANEL BY RT-PCR (RSV, FLU A&B, COVID)  [id number]    EKG None  Radiology No results found.  Procedures Procedures    Medications Ordered in ED Medications - No data to display  ED Course/ Medical Decision Making/ A&P                             Medical Decision Making  This patient presents to the ED for sore throat, body aches, this involves an extensive number of treatment options, and is a complaint that carries with a high risk of complications and morbidity.  The differential diagnosis includes flu, COVID, RSV, strep, pharyngitis, bronchitis, pneumonia, infectious etiology.  This is not an exhaustive list.  Lab tests: I ordered and personally interpreted labs.  The pertinent results  include: Viral panel negative. Strep test negative.  Imaging studies:  Problem list/ ED course/ Critical interventions/ Medical management: HPI: See above Vital signs within normal range and stable throughout visit. Laboratory/imaging studies significant for: See above. On physical examination, patient is afebrile and appears in no acute distress. This patient presents with symptoms suspicious for viral URI. Based on history and physical doubt sinusitis. COVID test was negative. Do not suspect underlying cardiopulmonary process. I considered, but think unlikely, pneumonia. Patient is nontoxic appearing and not in need of emergent medical intervention. Patient told to self isolate at home until symptoms subside for 72 hours. Recommended patient to gargle with salt water for symptom relief.  Follow-up with primary care physician for further evaluation and management.  Return to the ER if new or worsening symptoms. I have reviewed the patient home medicines and have made adjustments as needed.  Cardiac monitoring/EKG: The patient was maintained on a cardiac monitor.  I personally reviewed and interpreted the cardiac monitor which showed an underlying rhythm of: sinus rhythm.  Additional history obtained: External records from outside source obtained and reviewed including: Chart review including previous notes, labs, imaging.  Consultations obtained:  Disposition Continued outpatient therapy. Follow-up with PCP recommended for reevaluation of symptoms. Treatment plan discussed with patient.  Pt acknowledged understanding was agreeable to the plan. Worrisome signs and symptoms were discussed with patient, and patient acknowledged understanding to return to the ED if they noticed these signs and symptoms. Patient was stable upon discharge.   This chart was dictated using voice recognition software.  Despite best efforts to proofread,  errors can occur which can change the documentation meaning.           Final Clinical Impression(s) / ED Diagnoses Final diagnoses:  Sore throat    Rx / DC Orders ED Discharge Orders     None         Rex Kras, [place] 04/06/22 1817    Pattricia Boss, MD 04/08/22 1626

## 2022-04-06 NOTE — ED Notes (Signed)
Pt given discharge instructions. Opportunities given for questions. Pt verbalizes understanding. Ayauna Mcnay R, RN 

## 2022-04-06 NOTE — Discharge Instructions (Signed)
You tested negative for strep, COVID and flu today. Please gargle with salt water for symptom relief. Take tylenol/ibuprofen for pain/fever. I recommend close follow-up with PCP for reevaluation.  Please do not hesitate to return to emergency department if worrisome signs symptoms we discussed become apparent.

## 2022-04-06 NOTE — ED Notes (Signed)
Pt c/o sore throat. Afebrile

## 2022-04-17 ENCOUNTER — Encounter: Payer: Self-pay | Admitting: Obstetrics

## 2022-04-17 ENCOUNTER — Ambulatory Visit (INDEPENDENT_AMBULATORY_CARE_PROVIDER_SITE_OTHER): Payer: BC Managed Care – PPO | Admitting: Obstetrics

## 2022-04-17 DIAGNOSIS — E569 Vitamin deficiency, unspecified: Secondary | ICD-10-CM

## 2022-04-17 DIAGNOSIS — Z3009 Encounter for other general counseling and advice on contraception: Secondary | ICD-10-CM

## 2022-04-17 MED ORDER — CONCEPT OB 130-92.4-1 MG PO CAPS: 1.0000 | ORAL_CAPSULE | Freq: Every day | ORAL | 3 refills | Status: DC

## 2022-04-17 NOTE — Progress Notes (Signed)
[facility] Partum Visit Note  Alexandra Hart is a 32 y.o. G34P2002 female who presents for a postpartum visit. She is 6 weeks postpartum following a normal spontaneous vaginal delivery.  I have fully reviewed the prenatal and intrapartum course. The delivery was at 75w3dgestational weeks.  Anesthesia: none. Postpartum course has been unremarkable. Baby is doing well. Baby is feeding by breast. Bleeding no bleeding. Bowel function is normal. Bladder function is normal. Patient is not sexually active. Contraception method is none. Postpartum depression screening: negative.   The pregnancy intention screening data noted above was reviewed. Potential methods of contraception were discussed. The patient elected to proceed with No data recorded.   Edinburgh Postnatal Depression Scale - 04/17/22 1314       Edinburgh Postnatal Depression Scale:  In the Past 7 Days   I have been able to laugh and see the funny side of things. 0    I have looked forward with enjoyment to things. 0    I have blamed myself unnecessarily when things went wrong. 0    I have been anxious or worried for no good reason. 0    I have felt scared or panicky for no good reason. 0    Things have been getting on top of me. 0    I have been so unhappy that I have had difficulty sleeping. 0    I have felt sad or miserable. 0    I have been so unhappy that I have been crying. 0    The thought of harming myself has occurred to me. 0    Edinburgh Postnatal Depression Scale Total 0             Health Maintenance Due  Topic Date Due   COVID-19 Vaccine (1) Never done   INFLUENZA VACCINE  10/03/2021    The following portions of the patient's history were reviewed and updated as appropriate: allergies, current medications, past family history, past medical history, past social history, past surgical history, and problem list.  Review of Systems A comprehensive review of systems was negative.  Objective:  BP 117/72   Pulse  74   Ht 5' (1.524 m)   Wt 159 lb 9.6 oz (72.4 kg)   LMP 06/03/2021   Breastfeeding Yes   BMI 31.17 kg/m    General:  alert and no distress   Breasts:  normal  Lungs: clear to auscultation bilaterally  Heart:  regular rate and rhythm, S1, S2 normal, no murmur, click, rub or gallop  Abdomen: soft, non-tender; bowel sounds normal; no masses,  no organomegaly   Wound none  GU exam:  not indicated       Assessment:   1. Postpartum care following vaginal delivery - doing well  2. Encounter for other general counseling and advice on contraception - declines contraception  3. Vitamin deficiency Rx: - Prenat w/o A Vit-FeFum-FePo-FA (CONCEPT OB) 130-92.4-1 MG CAPS; Take 1 capsule by mouth daily.  Dispense: 90 capsule; Refill: 3    Plan:   Essential components of care per ACOG recommennegativedations:  1.  Mood and well being: Patient with  depression screening today. Reviewed local resources for support.  - Patient tobacco use? No.   - hx of drug use? No.    2. Infant care and feeding:  -Patient currently breastmilk feeding? Yes. Discussed returning to work and pumping. Reviewed importance of draining breast regularly to support lactation.  -Social determinants of health (SDOH) reviewed in EPIC. No concerns  3. Sexuality, contraception and birth spacing - Patient does not want a pregnancy in the next year.  Desired family size is 2 children.  - Reviewed reproductive life planning. Reviewed contraceptive methods based on pt preferences and effectiveness.  Patient desired No Method - Other Reason today.   - Discussed birth spacing of 18 months  4. Sleep and fatigue -Encouraged family/partner/community support of 4 hrs of uninterrupted sleep to help with mood and fatigue  5. Physical Recovery  - Discussed patients delivery and complications. She describes her labor as good. - Patient had a C-section, no problems at delivery. Patient had  no  laceration. Perineal healing  reviewed. Patient expressed understanding - Patient has urinary incontinence? No. - Patient is safe to resume physical and sexual activity  6.  Health Maintenance - HM due items addressed Yes - Last pap smear  Diagnosis  Date Value Ref Range Status  08/24/2021   Final   - Negative for intraepithelial lesion or malignancy (NILM)   Pap smear not done at today's visit.  -Breast Cancer screening indicated? No.   7. Chronic Disease/Pregnancy Condition follow up: None   Baltazar Najjar, [place] for [facility] [place], [place] 04/17/22

## 2022-05-04 DIAGNOSIS — Z419 Encounter for procedure for purposes other than remedying health state, unspecified: Secondary | ICD-10-CM | POA: Diagnosis not present

## 2022-05-25 ENCOUNTER — Encounter: Payer: Self-pay | Admitting: Family

## 2022-05-25 NOTE — Telephone Encounter (Signed)
Please see pt msg and advise 

## 2022-05-26 NOTE — Telephone Encounter (Signed)
I believe this message should have gone to her OB provider.

## 2022-05-29 ENCOUNTER — Encounter: Payer: Self-pay | Admitting: Obstetrics

## 2022-05-29 ENCOUNTER — Ambulatory Visit (INDEPENDENT_AMBULATORY_CARE_PROVIDER_SITE_OTHER): Payer: BC Managed Care – PPO | Admitting: Obstetrics

## 2022-05-29 DIAGNOSIS — Z3009 Encounter for other general counseling and advice on contraception: Secondary | ICD-10-CM | POA: Diagnosis not present

## 2022-05-29 NOTE — Progress Notes (Signed)
Pt presents for PP f/u. SVD 1/2 Continues to breastfeed, not interested in birth control.

## 2022-05-29 NOTE — Progress Notes (Unsigned)
Subjective:     Alexandra Hart is a 32 y.o. female who presents for a postpartum visit. She is 3  months  postpartum following a spontaneous vaginal delivery. I have fully reviewed the prenatal and intrapartum course. The delivery was at 74 gestational weeks. Outcome: spontaneous vaginal delivery. Anesthesia: none. Postpartum course has been normal. Baby's course has been normal. Baby is feeding by breast. Bleeding no bleeding. Bowel function is normal. Bladder function is normal. Patient is not sexually active. Contraception method is abstinence. Postpartum depression screening: negative.  Tobacco, alcohol and substance abuse history reviewed.  Adult immunizations reviewed including TDAP, rubella and varicella.  The following portions of the patient's history were reviewed and updated as appropriate: allergies, current medications, past family history, past medical history, past social history, past surgical history, and problem list.  Review of Systems A comprehensive review of systems was negative.   Objective:    BP 125/74   Pulse 86   Ht 5' (1.524 m)   Wt 161 lb 12.8 oz (73.4 kg)   LMP 06/03/2021   Breastfeeding Yes   BMI 31.60 kg/m   General:  alert and no distress   Breasts:  inspection negative, no nipple discharge or bleeding, no masses or nodularity palpable  Lungs: clear to auscultation bilaterally  Heart:  regular rate and rhythm, S1, S2 normal, no murmur, click, rub or gallop  Abdomen: soft, non-tender; bowel sounds normal; no masses,  no organomegaly   Vulva:  normal  Vagina: not evaluated  Cervix:  Not evaluated  Corpus: not examined  Adnexa:  not evaluated  Rectal Exam: Not performed.          I have spent a total of 20 minutes of face-to-face time, excluding clinical staff time, reviewing notes and   Assessment:    1. Postpartum care following vaginal delivery - doing well  2. Encounter for other general counseling and advice on contraception - declines  contraception.  She is abstinent    Plan:    1. Contraception: abstinence 2. Continue PNV's 3. Follow up in: 3 months or as needed.   Healthy lifestyle practices reviewed   Shelly Bombard, MD 05/29/2022

## 2022-05-31 ENCOUNTER — Encounter: Payer: Self-pay | Admitting: Obstetrics

## 2022-06-04 DIAGNOSIS — Z419 Encounter for procedure for purposes other than remedying health state, unspecified: Secondary | ICD-10-CM | POA: Diagnosis not present

## 2022-06-18 ENCOUNTER — Telehealth: Payer: Self-pay

## 2022-06-18 NOTE — Telephone Encounter (Signed)
Breastpump order signed and faxed.

## 2022-07-04 DIAGNOSIS — Z419 Encounter for procedure for purposes other than remedying health state, unspecified: Secondary | ICD-10-CM | POA: Diagnosis not present

## 2022-08-04 DIAGNOSIS — Z419 Encounter for procedure for purposes other than remedying health state, unspecified: Secondary | ICD-10-CM | POA: Diagnosis not present

## 2022-09-03 DIAGNOSIS — Z419 Encounter for procedure for purposes other than remedying health state, unspecified: Secondary | ICD-10-CM | POA: Diagnosis not present

## 2022-10-04 DIAGNOSIS — Z419 Encounter for procedure for purposes other than remedying health state, unspecified: Secondary | ICD-10-CM | POA: Diagnosis not present

## 2022-11-04 DIAGNOSIS — Z419 Encounter for procedure for purposes other than remedying health state, unspecified: Secondary | ICD-10-CM | POA: Diagnosis not present

## 2022-11-14 ENCOUNTER — Ambulatory Visit: Payer: Medicaid Other | Admitting: Family

## 2022-11-14 ENCOUNTER — Encounter: Payer: Self-pay | Admitting: Family

## 2022-11-14 VITALS — BP 109/52 | HR 63 | Temp 97.3°F | Ht 60.0 in | Wt 166.6 lb

## 2022-11-14 DIAGNOSIS — H61893 Other specified disorders of external ear, bilateral: Secondary | ICD-10-CM

## 2022-11-14 DIAGNOSIS — Z0001 Encounter for general adult medical examination with abnormal findings: Secondary | ICD-10-CM | POA: Diagnosis not present

## 2022-11-14 DIAGNOSIS — Z0000 Encounter for general adult medical examination without abnormal findings: Secondary | ICD-10-CM

## 2022-11-14 LAB — CBC WITH DIFFERENTIAL/PLATELET
Basophils Absolute: 0 10*3/uL (ref 0.0–0.1)
Basophils Relative: 0.4 % (ref 0.0–3.0)
Eosinophils Absolute: 0.1 10*3/uL (ref 0.0–0.7)
Eosinophils Relative: 2.6 % (ref 0.0–5.0)
HCT: 42 % (ref 36.0–46.0)
Hemoglobin: 14.1 g/dL (ref 12.0–15.0)
Lymphocytes Relative: 42.4 % (ref 12.0–46.0)
Lymphs Abs: 2.2 10*3/uL (ref 0.7–4.0)
MCHC: 33.5 g/dL (ref 30.0–36.0)
MCV: 86.4 fl (ref 78.0–100.0)
Monocytes Absolute: 0.3 10*3/uL (ref 0.1–1.0)
Monocytes Relative: 6.3 % (ref 3.0–12.0)
Neutro Abs: 2.5 10*3/uL (ref 1.4–7.7)
Neutrophils Relative %: 48.3 % (ref 43.0–77.0)
Platelets: 256 10*3/uL (ref 150.0–400.0)
RBC: 4.86 Mil/uL (ref 3.87–5.11)
RDW: 12.9 % (ref 11.5–15.5)
WBC: 5.2 10*3/uL (ref 4.0–10.5)

## 2022-11-14 LAB — COMPREHENSIVE METABOLIC PANEL
ALT: 12 U/L (ref 0–35)
AST: 14 U/L (ref 0–37)
Albumin: 4.1 g/dL (ref 3.5–5.2)
Alkaline Phosphatase: 57 U/L (ref 39–117)
BUN: 8 mg/dL (ref 6–23)
CO2: 24 meq/L (ref 19–32)
Calcium: 9.3 mg/dL (ref 8.4–10.5)
Chloride: 106 meq/L (ref 96–112)
Creatinine, Ser: 0.92 mg/dL (ref 0.40–1.20)
GFR: 83.75 mL/min (ref >60.00)
Glucose, Bld: 71 mg/dL (ref 70–99)
Potassium: 4 meq/L (ref 3.5–5.1)
Sodium: 138 meq/L (ref 135–145)
Total Bilirubin: 0.3 mg/dL (ref 0.2–1.2)
Total Protein: 7 g/dL (ref 6.0–8.3)

## 2022-11-14 LAB — LIPID PANEL
Cholesterol: 148 mg/dL (ref 0–200)
HDL: 69.9 mg/dL (ref >39.00)
LDL Cholesterol: 65 mg/dL (ref 0–99)
NonHDL: 78.31
Total CHOL/HDL Ratio: 2
Triglycerides: 68 mg/dL (ref 0.0–149.0)
VLDL: 13.6 mg/dL (ref 0.0–40.0)

## 2022-11-14 LAB — TSH: TSH: 1.26 u[IU]/mL (ref 0.35–5.50)

## 2022-11-14 MED ORDER — HYDROCORTISONE-ACETIC ACID 1-2 % OT SOLN: 4.0000 [drp] | Freq: Two times a day (BID) | OTIC | 0 refills | Status: DC

## 2022-11-14 NOTE — Progress Notes (Addendum)
Phone [phone]  Subjective:   Patient is a 32 y.o. female presenting for annual physical.    Chief Complaint  Patient presents with   Annual Exam    Fasting w/ labs - except for coffee w/cream & sugar    See problem oriented charting- ROS- full  review of systems was completed and negative.   The following were reviewed and entered/updated in epic: Past Medical History:  Diagnosis Date   Generalized anxiety disorder 05/19/2018   Headache    Post partum depression 05/19/2018   Supervision of normal first pregnancy, antepartum 12/27/2016    Clinic  [facility] Prenatal Labs Dating   LMP Blood type: O/Positive/-- (11/19 1007) O pos Genetic Screen 1 Screen: nl NT    AFP:  Negative  Antibody:Negative (11/19 1007)Neg Anatomic [place] normal Rubella: 4.92 (11/19 1007)Immune GTT Early:               Third trimester: nl 2 hour RPR: Non Reactive (03/11 1115) NR Flu vaccine 01/21/17 HBsAg: Negative (11/19 1007) Neg TDaP vaccine  05/13/17                Vaginal Pap smear, abnormal    Patient Active Problem List   Diagnosis Date Noted   Normal labor and delivery 03/06/2022   Umbilical hernia without obstruction and without gangrene 12/28/2021   Vaginal delivery 08/09/2017   Past Surgical History:  Procedure Laterality Date   NO PAST SURGERIES      Family History  Problem Relation Age of Onset   Varicose Veins Mother    Miscarriages / Stillbirths Sister    Asthma Maternal Grandmother    Diabetes Maternal Grandmother    Hypertension Maternal Grandmother    Varicose Veins Maternal Grandmother     Medications- reviewed and updated Current Outpatient Medications  Medication Sig Dispense Refill   acetic acid-hydrocortisone (VOSOL-HC) OTIC solution Place 4 drops into both ears 2 (two) times daily. Apply to affected ear. For ear canal itching, pain. 10 mL 0   Blood Pressure Monitoring (BLOOD PRESSURE KIT) DEVI 1 Device by Does not apply route once a week. 1 each 0   No current  facility-administered medications for this visit.    Allergies-reviewed and updated Allergies  Allergen Reactions   Shrimp [Shellfish Allergy] Nausea And Vomiting   Tomato Itching and Rash    Social History   Social History Narrative   Not on file    Objective:  BP (!) 109/52 (BP Location: Left Arm, Patient Position: Sitting, Cuff Size: Large)   Pulse 63   Temp (!) 97.3 F (36.3 C) (Temporal)   Ht 5' (1.524 m)   Wt 166 lb 9.6 oz (75.6 kg)   LMP 11/14/2022 (Exact Date)   SpO2 100%   Breastfeeding Yes   BMI 32.54 kg/m  Physical Exam Vitals and nursing note reviewed.  Constitutional:      Appearance: Normal appearance.  HENT:     Head: Normocephalic.     Right Ear: Tympanic membrane is scarred and retracted. Tympanic membrane is not erythematous.     Left Ear: Tympanic membrane is scarred and retracted. Tympanic membrane is not erythematous.     Ears:     Comments: mild erythema, irritation noted in bilateral ear canals    Nose: Nose normal.     Mouth/Throat:     Mouth: Mucous membranes are moist.  Eyes:     Pupils: Pupils are equal, round, and reactive to light.  Cardiovascular:     Rate and  Rhythm: Normal rate and regular rhythm.  Pulmonary:     Effort: Pulmonary effort is normal.     Breath sounds: Normal breath sounds.  Musculoskeletal:        General: Normal range of motion.     Cervical back: Normal range of motion.  Lymphadenopathy:     Cervical: No cervical adenopathy.  Skin:    General: Skin is warm and dry.  Neurological:     Mental Status: She is alert.  Psychiatric:        Mood and Affect: Mood normal.        Behavior: Behavior normal.     Assessment and Plan   Health Maintenance counseling: 1. Anticipatory guidance: Patient counseled regarding regular dental exams q6 months, eye exams,  avoiding smoking and second hand smoke, limiting alcohol to 1 beverage per day, no illicit drugs.   2. Risk factor reduction:  Advised patient of need for  regular exercise and diet rich with fruits and vegetables to reduce risk of heart attack and stroke. Wt Readings from Last 3 Encounters:  11/14/22 166 lb 9.6 oz (75.6 kg)  05/29/22 161 lb 12.8 oz (73.4 kg)  04/17/22 159 lb 9.6 oz (72.4 kg)   3. Immunizations/screenings/ancillary studies Immunization History  Administered Date(s) Administered   Influenza,inj,Quad PF,6+ Mos 01/21/2017   Influenza-Unspecified 01/21/2017, 12/03/2017   Tdap 05/13/2017   There are no preventive care reminders to display for this patient.  4. Cervical cancer screening: done 2023 5. Skin cancer screening- advised regular sunscreen use. Denies worrisome, changing, or new skin lesions.  6. Birth control/STD check:  N/A 7. Smoking associated screening: non- smoker 8. Alcohol screening: none  Annual physical exam -     CBC with Differential/Platelet -     Comprehensive metabolic panel -     Lipid panel -     TSH  Irritation of external ear canal, bilateral -     Hydrocortisone-Acetic Acid; Place 4 drops into both ears 2 (two) times daily. Apply to affected ear. For ear canal itching, pain.  Dispense: 10 mL; Refill: 0   Recommended follow up:  Return for any future concerns. No future appointments.  Lab/Order associations:fasting    Dulce Sellar, NP

## 2022-11-14 NOTE — Patient Instructions (Addendum)
It was very nice to see you today!   I will review your lab results via MyChart in a few days.   Have a great week!    PLEASE NOTE:  If you had any lab tests please let us know if you have not heard back within a few days. You may see your results on MyChart before we have a chance to review them but we will give you a call once they are reviewed by [place]. If we ordered any referrals today, please let us know if you have not heard from their office within the next week.

## 2022-11-14 NOTE — Addendum Note (Signed)
Addended byDulce Sellar on: 11/14/2022 02:13 PM   Modules accepted: Orders

## 2022-12-04 DIAGNOSIS — Z419 Encounter for procedure for purposes other than remedying health state, unspecified: Secondary | ICD-10-CM | POA: Diagnosis not present

## 2023-01-04 DIAGNOSIS — Z419 Encounter for procedure for purposes other than remedying health state, unspecified: Secondary | ICD-10-CM | POA: Diagnosis not present

## 2023-02-03 DIAGNOSIS — Z419 Encounter for procedure for purposes other than remedying health state, unspecified: Secondary | ICD-10-CM | POA: Diagnosis not present

## 2023-03-06 DIAGNOSIS — Z419 Encounter for procedure for purposes other than remedying health state, unspecified: Secondary | ICD-10-CM | POA: Diagnosis not present

## 2023-04-06 DIAGNOSIS — Z419 Encounter for procedure for purposes other than remedying health state, unspecified: Secondary | ICD-10-CM | POA: Diagnosis not present

## 2023-04-10 ENCOUNTER — Ambulatory Visit
Admission: EM | Admit: 2023-04-10 | Discharge: 2023-04-10 | Disposition: A | Discharge status: Home / Self Care | Payer: Medicaid Other | Attending: Family Medicine | Admitting: Family Medicine

## 2023-04-10 DIAGNOSIS — J111 Influenza due to unidentified influenza virus with other respiratory manifestations: Secondary | ICD-10-CM

## 2023-04-10 LAB — POC COVID19/FLU A&B COMBO
Covid Antigen, POC: NEGATIVE
Influenza A Antigen, POC: POSITIVE — AB
Influenza B Antigen, POC: NEGATIVE

## 2023-04-10 MED ORDER — IBUPROFEN 600 MG PO TABS: 600.0000 mg | ORAL_TABLET | Freq: Four times a day (QID) | ORAL | 0 refills | Status: DC | PRN

## 2023-04-10 MED ORDER — CETIRIZINE HCL 10 MG PO TABS: 10.0000 mg | ORAL_TABLET | Freq: Every day | ORAL | 0 refills | Status: DC

## 2023-04-10 MED ORDER — OSELTAMIVIR PHOSPHATE 75 MG PO CAPS: 75.0000 mg | ORAL_CAPSULE | Freq: Two times a day (BID) | ORAL | 0 refills | Status: DC

## 2023-04-10 MED ORDER — PSEUDOEPHEDRINE HCL 30 MG PO TABS: 30.0000 mg | ORAL_TABLET | Freq: Three times a day (TID) | ORAL | 0 refills | Status: AC | PRN

## 2023-04-10 NOTE — ED Provider Notes (Signed)
 [facility]  Note:  This document was prepared using Conservation officer, historic buildings and may include unintentional dictation errors.  MRN: [id number] DOB: 1990/03/08  Subjective:   Alexandra Hart is a 33 y.o. female presenting for 2 to 3-day history of acute onset body aches, malaise, fatigue, runny and stuffy nose, sneezing, headaches.  Had exposure to influenza from 2 family members.  Would like to be tested.  Presents with her son as well.  No current facility-administered medications for this encounter.  Current Outpatient Medications:    acetic acid -hydrocortisone  (VOSOL -HC) OTIC solution, Place 4 drops into both ears 2 (two) times daily. Apply to affected ear. For ear canal itching, pain., Disp: 10 mL, Rfl: 0   Blood Pressure Monitoring (BLOOD PRESSURE KIT) DEVI, 1 Device by Does not apply route once a week., Disp: 1 each, Rfl: 0   Allergies  Allergen Reactions   Shrimp [Shellfish Allergy] Nausea And Vomiting   Tomato Itching and Rash    Past Medical History:  Diagnosis Date   Generalized anxiety disorder 05/19/2018   Headache    Post partum depression 05/19/2018   Supervision of normal first pregnancy, antepartum 12/27/2016    Clinic  [facility] Prenatal Labs Dating   LMP Blood type: O/Positive/-- (11/19 1007) O pos Genetic Screen 1 Screen: nl NT    AFP:  Negative  Antibody:Negative (11/19 1007)Neg Anatomic US  normal Rubella: 4.92 (11/19 1007)Immune GTT Early:               Third trimester: nl 2 hour RPR: Non Reactive (03/11 1115) NR Flu vaccine 01/21/17 HBsAg: Negative (11/19 1007) Neg TDaP vaccine  05/13/17                Vaginal Pap smear, abnormal      Past Surgical History:  Procedure Laterality Date   NO PAST SURGERIES      Family History  Problem Relation Age of Onset   Varicose Veins Mother    Miscarriages / Stillbirths Sister    Asthma Maternal Grandmother    Diabetes Maternal Grandmother    Hypertension Maternal Grandmother     Varicose Veins Maternal Grandmother     Social History   Tobacco Use   Smoking status: Former    Current packs/day: 0.00    Types: Cigarettes    Quit date: 11/27/2016    Years since quitting: 6.3    Passive exposure: Never   Smokeless tobacco: Never  Vaping Use   Vaping status: Former  Substance Use Topics   Alcohol use: Not Currently   Drug use: No    ROS   Objective:   Vitals: BP 114/78 (BP Location: Left Arm)   Pulse 98   Temp 98.7 F (37.1 C) (Oral)   Resp 16   SpO2 97%   Physical Exam Constitutional:      General: She is not in acute distress.    Appearance: Normal appearance. She is well-developed and normal weight. She is not ill-appearing, toxic-appearing or diaphoretic.  HENT:     Head: Normocephalic and atraumatic.     Right Ear: Tympanic membrane, ear canal and external ear normal. No drainage or tenderness. No middle ear effusion. There is no impacted cerumen. Tympanic membrane is not erythematous or bulging.     Left Ear: Tympanic membrane, ear canal and external ear normal. No drainage or tenderness.  No middle ear effusion. There is no impacted cerumen. Tympanic membrane is not erythematous or bulging.     Nose:  Nose normal. No congestion or rhinorrhea.     Mouth/Throat:     Mouth: Mucous membranes are moist. No oral lesions.     Pharynx: No pharyngeal swelling, oropharyngeal exudate, posterior oropharyngeal erythema or uvula swelling.     Tonsils: No tonsillar exudate or tonsillar abscesses.  Eyes:     General: No scleral icterus.       Right eye: No discharge.        Left eye: No discharge.     Extraocular Movements: Extraocular movements intact.     Right eye: Normal extraocular motion.     Left eye: Normal extraocular motion.     Conjunctiva/sclera: Conjunctivae normal.  Cardiovascular:     Rate and Rhythm: Normal rate and regular rhythm.     Heart sounds: Normal heart sounds. No murmur heard.    No friction rub. No gallop.  Pulmonary:      Effort: Pulmonary effort is normal. No respiratory distress.     Breath sounds: No stridor. No wheezing, rhonchi or rales.  Chest:     Chest wall: No tenderness.  Musculoskeletal:     Cervical back: Normal range of motion and neck supple.  Lymphadenopathy:     Cervical: No cervical adenopathy.  Skin:    General: Skin is warm and dry.  Neurological:     General: No focal deficit present.     Mental Status: She is alert and oriented to person, place, and time.  Psychiatric:        Mood and Affect: Mood normal.        Behavior: Behavior normal.     Assessment and Plan :   PDMP not reviewed this encounter.  1. Influenza    Patient requested testing.  This was pending at time of discharge.  Will cover for influenza with Tamiflu  given exposure, symptom set, current incidence in the community.  Use supportive care, rest, fluids, hydration, light meals, schedule Tylenol  and ibuprofen . Counseled patient on potential for adverse effects with medications prescribed today, patient verbalized understanding. ER and return-to-clinic precautions discussed, patient verbalized understanding.    Alexandra Hart, [place] 04/10/23 [id number]

## 2023-04-10 NOTE — ED Triage Notes (Signed)
 Pt reports runny nose, sneezing, headache, muscle aches x 2-3 days; soreness and tenderness  in left shoulder, left arm and left sided arm 4-5 days.

## 2023-04-10 NOTE — Discharge Instructions (Addendum)
 Start Tamiflu  for influenza due to your exposure. For sore throat or cough try using a honey-based tea. Use 3 teaspoons of honey with juice squeezed from half lemon. Place shaved pieces of ginger into 1/2-1 cup of water and warm over stove top. Then mix the ingredients and repeat every 4 hours as needed. Please take ibuprofen  600mg  every 6 hours with food alternating with OR taken together with Tylenol  500mg -650mg  every 6 hours for throat pain, fevers, aches and pains. Hydrate very well with at least 2 liters of water. Eat light meals such as soups (chicken and noodles, vegetable, chicken and wild rice).  Do not eat foods that you are allergic to.  Taking an antihistamine like Zyrtec  (10mg  daily) can help against postnasal drainage, sinus congestion which can cause sinus pain, sinus headaches, throat pain, painful swallowing, coughing.  You can take this together with pseudoephedrine  (Sudafed) at a dose of 30mg  3 times a day or twice daily as needed for the same kind of nasal drip, congestion.

## 2023-05-04 DIAGNOSIS — Z419 Encounter for procedure for purposes other than remedying health state, unspecified: Secondary | ICD-10-CM | POA: Diagnosis not present

## 2023-05-11 ENCOUNTER — Other Ambulatory Visit: Payer: Self-pay

## 2023-05-11 ENCOUNTER — Emergency Department (HOSPITAL_BASED_OUTPATIENT_CLINIC_OR_DEPARTMENT_OTHER): Admitting: Radiology

## 2023-05-11 ENCOUNTER — Encounter (HOSPITAL_BASED_OUTPATIENT_CLINIC_OR_DEPARTMENT_OTHER): Payer: Self-pay | Admitting: Emergency Medicine

## 2023-05-11 ENCOUNTER — Emergency Department (HOSPITAL_BASED_OUTPATIENT_CLINIC_OR_DEPARTMENT_OTHER)
Admission: EM | Admit: 2023-05-11 | Discharge: 2023-05-11 | Disposition: A | Discharge status: Home / Self Care | Attending: Emergency Medicine | Admitting: Emergency Medicine

## 2023-05-11 ENCOUNTER — Other Ambulatory Visit (HOSPITAL_BASED_OUTPATIENT_CLINIC_OR_DEPARTMENT_OTHER): Payer: Self-pay

## 2023-05-11 DIAGNOSIS — R0981 Nasal congestion: Secondary | ICD-10-CM | POA: Diagnosis not present

## 2023-05-11 DIAGNOSIS — R079 Chest pain, unspecified: Secondary | ICD-10-CM | POA: Insufficient documentation

## 2023-05-11 DIAGNOSIS — O200 Threatened abortion: Secondary | ICD-10-CM | POA: Diagnosis not present

## 2023-05-11 DIAGNOSIS — O99511 Diseases of the respiratory system complicating pregnancy, first trimester: Secondary | ICD-10-CM | POA: Diagnosis not present

## 2023-05-11 DIAGNOSIS — J00 Acute nasopharyngitis [common cold]: Secondary | ICD-10-CM | POA: Diagnosis not present

## 2023-05-11 DIAGNOSIS — J3489 Other specified disorders of nose and nasal sinuses: Secondary | ICD-10-CM | POA: Insufficient documentation

## 2023-05-11 DIAGNOSIS — R0789 Other chest pain: Secondary | ICD-10-CM | POA: Diagnosis not present

## 2023-05-11 LAB — HCG, QUANTITATIVE, PREGNANCY: hCG, Beta Chain, Quant, S: 1920 m[IU]/mL — ABNORMAL HIGH (ref <5)

## 2023-05-11 LAB — BASIC METABOLIC PANEL
Anion gap: 10 (ref 5–15)
BUN: 8 mg/dL (ref 6–20)
CO2: 22 mmol/L (ref 22–32)
Calcium: 9.2 mg/dL (ref 8.9–10.3)
Chloride: 104 mmol/L (ref 98–111)
Creatinine, Ser: 0.97 mg/dL (ref 0.44–1.00)
GFR, Estimated: >60 mL/min (ref >60)
Glucose, Bld: 99 mg/dL (ref 70–99)
Potassium: 3.7 mmol/L (ref 3.5–5.1)
Sodium: 136 mmol/L (ref 135–145)

## 2023-05-11 LAB — CBC
HCT: 40.1 % (ref 36.0–46.0)
Hemoglobin: 14 g/dL (ref 12.0–15.0)
MCH: 29.3 pg (ref 26.0–34.0)
MCHC: 34.9 g/dL (ref 30.0–36.0)
MCV: 83.9 fL (ref 80.0–100.0)
Platelets: 238 10*3/uL (ref 150–400)
RBC: 4.78 MIL/uL (ref 3.87–5.11)
RDW: 12.7 % (ref 11.5–15.5)
WBC: 3.7 10*3/uL — ABNORMAL LOW (ref 4.0–10.5)
nRBC: 0 % (ref 0.0–0.2)

## 2023-05-11 LAB — RESP PANEL BY RT-PCR (RSV, FLU A&B, COVID)  RVPGX2
Influenza A by PCR: NEGATIVE
Influenza B by PCR: NEGATIVE
Resp Syncytial Virus by PCR: NEGATIVE
SARS Coronavirus 2 by RT PCR: NEGATIVE

## 2023-05-11 LAB — PREGNANCY, URINE: Preg Test, Ur: POSITIVE — AB

## 2023-05-11 LAB — TROPONIN I (HIGH SENSITIVITY): Troponin I (High Sensitivity): <2 ng/L (ref <18)

## 2023-05-11 MED ORDER — FLUTICASONE PROPIONATE 50 MCG/ACT NA SUSP
1.0000 | Freq: Every day | NASAL | 2 refills | Status: AC
  Filled 2023-05-11: qty 48, 84d supply, fill #0

## 2023-05-11 MED ORDER — PRENATAL 27-0.8 MG PO TABS
1.0000 | ORAL_TABLET | Freq: Every day | ORAL | 0 refills | Status: AC
  Filled 2023-05-11: qty 30, 30d supply, fill #0

## 2023-05-11 NOTE — ED Provider Notes (Signed)
 [place] EMERGENCY DEPARTMENT AT [facility] Provider Note   CSN: [id number] Arrival date & time: 05/11/23  1105     History  Chief Complaint  Patient presents with   Chest Pain    Alexandra Hart is a 33 y.o. female.  The history is provided by the patient and medical records. No language interpreter was used.  Chest Pain    33 year old female with history of anxiety, postpartum depression presenting with cold symptoms.  Patient report for the past 2 days she has had some sinus congestion, nasal drainage, as well as having some chest congestion and sneezing.  She endorsed occasional sharp shooting pain in her chest.  Furthermore, she also report having vaginal spotting for the past 2 weeks that is not normal for her.  She is a G2, P2.  No vaginal discharge no abdominal pain no chest pain no lightheadedness or dizziness no nausea vomiting or diarrhea.  She had flu a month ago.  She denies any environmental changes.  No history of seasonal allergy.  Home Medications Prior to Admission medications   Medication Sig Start Date End Date Taking? Authorizing Provider  acetic acid-hydrocortisone (VOSOL-HC) OTIC solution Place 4 drops into both ears 2 (two) times daily. Apply to affected ear. For ear canal itching, pain. 11/14/22   Dulce Sellar, NP  Blood Pressure Monitoring (BLOOD PRESSURE KIT) DEVI 1 Device by Does not apply route once a week. 08/16/21   Hermina Staggers, MD  cetirizine (ZYRTEC ALLERGY) 10 MG tablet Take 1 tablet (10 mg total) by mouth daily. 04/10/23   Wallis Bamberg, PA-C  ibuprofen (ADVIL) 600 MG tablet Take 1 tablet (600 mg total) by mouth every 6 (six) hours as needed. 04/10/23   Wallis Bamberg, PA-C  oseltamivir (TAMIFLU) 75 MG capsule Take 1 capsule (75 mg total) by mouth 2 (two) times daily. 04/10/23   Wallis Bamberg, PA-C  pseudoephedrine (SUDAFED) 30 MG tablet Take 1 tablet (30 mg total) by mouth every 8 (eight) hours as needed for congestion. 04/10/23   Wallis Bamberg,  PA-C      Allergies    Shrimp [shellfish allergy] and Tomato    Review of Systems   Review of Systems  Cardiovascular:  Positive for chest pain.  All other systems reviewed and are negative.   Physical Exam Updated Vital Signs BP 115/76   Pulse 92   Temp 98.2 F (36.8 C)   Resp 18   Ht 5\' 1"  (1.549 m)   Wt 75.8 kg   LMP 04/27/2023 (Approximate)   SpO2 100%   BMI 31.55 kg/m  Physical Exam Vitals and nursing note reviewed.  Constitutional:      General: She is not in acute distress.    Appearance: She is well-developed.  HENT:     Head: Atraumatic.     Nose: Nose normal.     Mouth/Throat:     Mouth: Mucous membranes are moist.  Eyes:     Conjunctiva/sclera: Conjunctivae normal.  Cardiovascular:     Rate and Rhythm: Normal rate and regular rhythm.     Pulses: Normal pulses.     Heart sounds: Normal heart sounds.  Pulmonary:     Effort: Pulmonary effort is normal.  Abdominal:     Palpations: Abdomen is soft.     Tenderness: There is no abdominal tenderness.  Musculoskeletal:     Cervical back: Neck supple.  Skin:    Findings: No rash.  Neurological:     Mental Status: She is alert.  Mental status is at baseline.  Psychiatric:        Mood and Affect: Mood normal.     ED Results / Procedures / Treatments   Labs (all labs ordered are listed, but only abnormal results are displayed) Labs Reviewed  CBC - Abnormal; Notable for the following components:      Result Value   WBC 3.7 (*)    All other components within normal limits  PREGNANCY, URINE - Abnormal; Notable for the following components:   Preg Test, Ur POSITIVE (*)    All other components within normal limits  HCG, QUANTITATIVE, PREGNANCY - Abnormal; Notable for the following components:   hCG, Beta Chain, Quant, S 1,920 (*)    All other components within normal limits  RESP PANEL BY RT-PCR (RSV, FLU A&B, COVID)  [id number]  BASIC METABOLIC PANEL  TROPONIN I (HIGH SENSITIVITY)    EKG EKG  Interpretation Date/Time:  Saturday May 11 2023 11:16:37 EST Ventricular Rate:  87 PR Interval:  182 QRS Duration:  70 QT Interval:  354 QTC Calculation: 425 R Axis:   23  Text Interpretation: Normal sinus rhythm with sinus arrhythmia Nonspecific ST and T wave abnormality Abnormal ECG No previous ECGs available Confirmed by Margarita Grizzle [phone]) on 05/11/2023 11:21:11 AM  Radiology DG Chest 2 View Result Date: 05/11/2023 CLINICAL DATA:  chest pain EXAM: CHEST - 2 VIEW COMPARISON:  April 21, 2014 FINDINGS: The cardiomediastinal silhouette is normal in contour. No pleural effusion. No pneumothorax. No acute pleuroparenchymal abnormality. Visualized abdomen is unremarkable. No acute osseous abnormality noted. IMPRESSION: No acute cardiopulmonary abnormality. Electronically Signed   By: Meda Klinefelter M.D.   On: 05/11/2023 11:58    Procedures Procedures    Medications Ordered in ED Medications - No data to display  ED Course/ Medical Decision Making/ A&P                                 Medical Decision Making Amount and/or Complexity of Data Reviewed Labs: ordered. Radiology: ordered.   BP 115/76   Pulse 92   Temp 98.2 F (36.8 C)   Resp 18   Ht 5\' 1"  (1.549 m)   Wt 75.8 kg   LMP 04/27/2023 (Approximate)   SpO2 100%   BMI 31.55 kg/m   17:77 PM  33 year old female with history of anxiety, postpartum depression presenting with cold symptoms.  Patient report for the past 2 days she has had some sinus congestion, nasal drainage, as well as having some chest congestion and sneezing.  She endorsed occasional sharp shooting pain in her chest.  Furthermore, she also report having vaginal spotting for the past 2 weeks that is not normal for her.  She is a G2, P2.  No vaginal discharge no abdominal pain no chest pain no lightheadedness or dizziness no nausea vomiting or diarrhea.  She had flu a month ago.  She denies any environmental changes.  No history of seasonal  allergy.  Exam overall reassuring, no chest tenderness no abdominal tenderness heart with normal rate and rhythm, lungs are clear to auscultation bilaterally ear nose throat exam unremarkable.  -Labs ordered, independently viewed and interpreted by me.  Labs remarkable for preg test positive, will check quantitative HcG. -The patient was maintained on a cardiac monitor.  I personally viewed and interpreted the cardiac monitored which showed an underlying rhythm of: NSR -Imaging independently viewed and interpreted by me and I agree  with radiologist's interpretation.  Result remarkable for CXR unremarkable -This patient presents to the ED for concern of chest pain, this involves an extensive number of treatment options, and is a complaint that carries with it a high risk of complications and morbidity.  The differential diagnosis includes seasonal allergy, atypical cp, covid/flu/rsv -Co morbidities that complicate the patient evaluation includes anxiety -Treatment includes supportive care -Reevaluation of the patient after these medicines showed that the patient improved -PCP office notes or outside notes reviewed -Escalation to admission/observation considered: patients feels much better, is comfortable with discharge, and will follow up with obgyn -Prescription medication considered, patient comfortable with flonase, prenatal vitamins -Social Determinant of Health considered which includes tobacco use.    I suspect her symptoms likely due to seasonal allergies.  Will prescribe Flonase for symptom control.  Incidentally patient was found to have a positive pregnancy test, quantitative hCG is 1920.  She is likely early in her pregnancy.  She does not exhibit any abdominal discomfort I have low suspicion for ectopic pregnancy but I am concerned the patient may have signs of threatened miscarriage with her vaginal spotting for the past several weeks.  She has provide stable normal hemoglobin.  I  encouraged patient to follow-up with OB/GYN for reassessment, repeat beta hCG in the next 2 to 4 days as well as likely OB ultrasound for further assessment.  Return precaution given.         Final Clinical Impression(s) / ED Diagnoses Final diagnoses:  Sinus congestion  Threatened miscarriage in early pregnancy    Rx / DC Orders ED Discharge Orders          Ordered    fluticasone (FLONASE) 50 MCG/ACT nasal spray  Daily        05/11/23 1419    Prenatal Vit-Fe Fumarate-FA (MULTIVITAMIN-PRENATAL) 27-0.8 MG TABS tablet  Daily        05/11/23 1419              Fayrene Helper, PA-C 05/11/23 1421    Margarita Grizzle, MD 05/13/23 [phone]

## 2023-05-11 NOTE — ED Triage Notes (Signed)
 Pt via pov from home with sneezing x 2 days and new today mid chest pain. Denies sob, n/v, dizziness. Pt reports that pain is sharp and "shoots from front to back." Pt alert & oriented, nad noted.

## 2023-05-11 NOTE — Discharge Instructions (Signed)
 Your symptoms likely due to seasonal allergies.  Use Flonase as prescribed.  Your pregnancy test is positive.  Currently your beta-hCG is 1920.  Please follow-up with your OB/GYN in the next week for reassessment which includes repeat beta-hCG.  Take prenatal vitamin as prescribed.  Return if you have any concern.

## 2023-05-11 NOTE — ED Notes (Signed)
 Patient transported to X-ray

## 2023-05-15 ENCOUNTER — Inpatient Hospital Stay (HOSPITAL_COMMUNITY)

## 2023-05-15 ENCOUNTER — Encounter (HOSPITAL_COMMUNITY): Payer: Self-pay

## 2023-05-15 ENCOUNTER — Encounter: Payer: Self-pay | Admitting: Obstetrics and Gynecology

## 2023-05-15 ENCOUNTER — Inpatient Hospital Stay (HOSPITAL_COMMUNITY)
Admission: AD | Admit: 2023-05-15 | Discharge: 2023-05-15 | Disposition: A | Discharge status: Home / Self Care | Attending: Obstetrics & Gynecology | Admitting: Obstetrics & Gynecology

## 2023-05-15 ENCOUNTER — Other Ambulatory Visit: Payer: Self-pay

## 2023-05-15 DIAGNOSIS — O200 Threatened abortion: Secondary | ICD-10-CM | POA: Diagnosis not present

## 2023-05-15 DIAGNOSIS — O209 Hemorrhage in early pregnancy, unspecified: Secondary | ICD-10-CM

## 2023-05-15 DIAGNOSIS — Z3A01 Less than 8 weeks gestation of pregnancy: Secondary | ICD-10-CM

## 2023-05-15 DIAGNOSIS — O3680X0 Pregnancy with inconclusive fetal viability, not applicable or unspecified: Secondary | ICD-10-CM

## 2023-05-15 DIAGNOSIS — Z3A00 Weeks of gestation of pregnancy not specified: Secondary | ICD-10-CM | POA: Diagnosis not present

## 2023-05-15 DIAGNOSIS — Z3201 Encounter for pregnancy test, result positive: Secondary | ICD-10-CM

## 2023-05-15 LAB — URINALYSIS, ROUTINE W REFLEX MICROSCOPIC
Bilirubin Urine: NEGATIVE
Glucose, UA: NEGATIVE mg/dL
Ketones, ur: NEGATIVE mg/dL
Leukocytes,Ua: NEGATIVE
Nitrite: NEGATIVE
Protein, ur: NEGATIVE mg/dL
RBC / HPF: >50 RBC/hpf (ref 0–5)
Specific Gravity, Urine: 1.023 (ref 1.005–1.030)
pH: 6 (ref 5.0–8.0)

## 2023-05-15 LAB — WET PREP, GENITAL
Clue Cells Wet Prep HPF POC: NONE SEEN
Sperm: NONE SEEN
Trich, Wet Prep: NONE SEEN
WBC, Wet Prep HPF POC: <10 (ref <10)
Yeast Wet Prep HPF POC: NONE SEEN

## 2023-05-15 LAB — CBC
HCT: 39.7 % (ref 36.0–46.0)
Hemoglobin: 13.8 g/dL (ref 12.0–15.0)
MCH: 28.8 pg (ref 26.0–34.0)
MCHC: 34.8 g/dL (ref 30.0–36.0)
MCV: 82.7 fL (ref 80.0–100.0)
Platelets: 266 10*3/uL (ref 150–400)
RBC: 4.8 MIL/uL (ref 3.87–5.11)
RDW: 12.4 % (ref 11.5–15.5)
WBC: 4.9 10*3/uL (ref 4.0–10.5)
nRBC: 0 % (ref 0.0–0.2)

## 2023-05-15 LAB — HIV ANTIBODY (ROUTINE TESTING W REFLEX): HIV Screen 4th Generation wRfx: NONREACTIVE

## 2023-05-15 LAB — HCG, QUANTITATIVE, PREGNANCY: hCG, Beta Chain, Quant, S: 580 m[IU]/mL — ABNORMAL HIGH (ref <5)

## 2023-05-15 NOTE — MAU Provider Note (Signed)
 History     CSN: [id number]  Arrival date and time: 05/15/23 0951   Event Date/Time   First Provider Initiated Contact with Patient 05/15/23 1450      Chief Complaint  Patient presents with   Vaginal Bleeding   HPI [name] is a 33 y.o. year old [id number] female at Unknown weeks gestation who presents to MAU reporting moderate VB last night and now light VB today. She reports she "could feel clots coming out." She denies any pain at this time; "except while they were doing the transvaginal U/S." She reports her LMP was the beginning of February and she bled again on 04/27/2023. She plans to start [facility] with [facility]; where she had [facility] with her previous 2 children.  OB History     Gravida  3   Para  2   Term  2   Preterm      AB      Living  2      SAB      IAB      Ectopic      Multiple  0   Live Births  2           Past Medical History:  Diagnosis Date   Generalized anxiety disorder 05/19/2018   Headache    Post partum depression 05/19/2018   Supervision of normal first pregnancy, antepartum 12/27/2016    Clinic  [facility] Prenatal Labs Dating   LMP Blood type: O/Positive/-- (11/19 1007) O pos Genetic Screen 1 Screen: nl NT    AFP:  Negative  Antibody:Negative (11/19 1007)Neg Anatomic [place] normal Rubella: 4.92 (11/19 1007)Immune GTT Early:               Third trimester: nl 2 hour RPR: Non Reactive (03/11 1115) NR Flu vaccine 01/21/17 HBsAg: Negative (11/19 1007) Neg TDaP vaccine  05/13/17                Vaginal Pap smear, abnormal     Past Surgical History:  Procedure Laterality Date   NO PAST SURGERIES      Family History  Problem Relation Age of Onset   Varicose Veins Mother    Miscarriages / Stillbirths Sister    Asthma Maternal Grandmother    Diabetes Maternal Grandmother    Hypertension Maternal Grandmother    Varicose Veins Maternal Grandmother     Social History   Tobacco Use   Smoking status: Former    Current packs/day: 0.00     Types: Cigarettes    Quit date: 11/27/2016    Years since quitting: 6.4    Passive exposure: Never   Smokeless tobacco: Never  Vaping Use   Vaping status: Every Day  Substance Use Topics   Alcohol use: Not Currently   Drug use: No    Allergies:  Allergies  Allergen Reactions   Shrimp [Shellfish Allergy] Nausea And Vomiting   Tomato Itching and Rash    No medications prior to admission.    Review of Systems  Constitutional: Negative.   HENT: Negative.    Eyes: Negative.   Respiratory: Negative.    Cardiovascular: Negative.   Gastrointestinal: Negative.   Endocrine: Negative.   Genitourinary:  Positive for vaginal bleeding (light).  Musculoskeletal: Negative.   Skin: Negative.   Allergic/Immunologic: Negative.   Neurological: Negative.   Hematological: Negative.   Psychiatric/Behavioral: Negative.     Physical Exam   Blood pressure 125/71, pulse 81, temperature 98 F (36.7 C), temperature source Oral, resp.  rate 20, height 5\' 1"  (1.549 m), weight 75.3 kg, SpO2 100%, not currently breastfeeding.  Physical Exam Vitals and nursing note reviewed.  Constitutional:      Appearance: Normal appearance. She is normal weight.  Neurological:     Mental Status: She is alert.     MAU Course  Procedures  MDM CCUA UPT CBC ABO/Rh HCG Wet Prep GC/CT -- Results pending  RPR -- Results pending  OB U/S < 14 wks TVUS  Results for orders placed or performed during the hospital encounter of 05/15/23 (from the past 24 hours)  Urinalysis, Routine w reflex microscopic -Urine, Clean Catch     Status: Abnormal   Collection Time: 05/15/23 11:00 AM  Result Value Ref Range   Color, Urine YELLOW YELLOW   APPearance HAZY (A) CLEAR   Specific Gravity, Urine 1.023 1.005 - 1.030   pH 6.0 5.0 - 8.0   Glucose, UA NEGATIVE NEGATIVE mg/dL   Hgb urine dipstick LARGE (A) NEGATIVE   Bilirubin Urine NEGATIVE NEGATIVE   Ketones, ur NEGATIVE NEGATIVE mg/dL   Protein, ur NEGATIVE NEGATIVE  mg/dL   Nitrite NEGATIVE NEGATIVE   Leukocytes,Ua NEGATIVE NEGATIVE   RBC / HPF >50 0 - 5 RBC/hpf   WBC, UA 0-5 0 - 5 WBC/hpf   Bacteria, UA RARE (A) NONE SEEN   Squamous Epithelial / HPF 0-5 0 - 5 /HPF   Mucus PRESENT   Wet prep, genital     Status: None   Collection Time: 05/15/23 12:21 PM   Specimen: PATH Cytology Cervicovaginal Ancillary Only  Result Value Ref Range   Yeast Wet Prep HPF POC NONE SEEN NONE SEEN   Trich, Wet Prep NONE SEEN NONE SEEN   Clue Cells Wet Prep HPF POC NONE SEEN NONE SEEN   WBC, Wet Prep HPF POC <10 <10   Sperm NONE SEEN   CBC     Status: None   Collection Time: 05/15/23  1:15 PM  Result Value Ref Range   WBC 4.9 4.0 - 10.5 K/uL   RBC 4.80 3.87 - 5.11 MIL/uL   Hemoglobin 13.8 12.0 - 15.0 g/dL   HCT 47.8 29.5 - 62.1 %   MCV 82.7 80.0 - 100.0 fL   MCH 28.8 26.0 - 34.0 pg   MCHC 34.8 30.0 - 36.0 g/dL   RDW 30.8 65.7 - 84.6 %   Platelets 266 150 - 400 K/uL   nRBC 0.0 0.0 - 0.2 %  hCG, quantitative, pregnancy     Status: Abnormal   Collection Time: 05/15/23  1:15 PM  Result Value Ref Range   hCG, Beta Chain, Quant, S 580 (H) <5 mIU/mL  HIV Antibody (routine testing w rflx)     Status: None   Collection Time: 05/15/23  1:15 PM  Result Value Ref Range   HIV Screen 4th Generation wRfx Non Reactive Non Reactive    US OB LESS THAN 14 WEEKS WITH OB TRANSVAGINAL Result Date: 05/15/2023 CLINICAL DATA:  Vaginal bleeding in first trimester of pregnancy. EXAM: OBSTETRIC <14 WK [place] AND TRANSVAGINAL OB US TECHNIQUE: Both transabdominal and transvaginal ultrasound examinations were performed for complete evaluation of the gestation as well as the maternal uterus, adnexal regions, and pelvic cul-de-sac. Transvaginal technique was performed to assess early pregnancy. COMPARISON:  None Available. FINDINGS: Intrauterine gestational sac: Single Yolk sac:  Not Visualized. Embryo:  Not Visualized. Cardiac Activity: Not Visualized. MSD: 8.1 mm   5 w   4 d Subchorionic  hemorrhage:  None visualized. Maternal  uterus/adnexae: Ovaries unremarkable. No free fluid is noted. IMPRESSION: Probable early intrauterine gestational sac, but no yolk sac, fetal pole, or cardiac activity yet visualized. Recommend follow-up quantitative B-HCG levels and follow-up US in 14 days to assess viability. This recommendation follows SRU consensus guidelines: Diagnostic Criteria for Nonviable Pregnancy Early in the First Trimester. Malva Limes Med 2013; 811:9147-82. Electronically Signed   By: Lupita Raider M.D.   On: 05/15/2023 17:06      Assessment and Plan  1. Pregnancy with uncertain fetal viability, single or unspecified fetus (Primary) - Diagnosis was previously PUL d/t no read on U/S - Advised to have repeat hCG in 48 hrs  2. Threatened miscarriage in early pregnancy - Information provided on threatened miscarriage   3. Vaginal bleeding in pregnancy, first trimester - Information provided on vaginal bleeding in pregnnacy  - Return to MAU: If you have heavier bleeding that soaks through more that 2 pads per hour for an hour or more If you bleed so much that you feel like you might pass out or you do pass out If you have significant abdominal pain that is not improved with Tylenol 1000 mg every 8 hours as needed for pain If you develop a fever > 100.5   4. Positive pregnancy test - Unsure of weeks gestation d/t unsure LMP dates  - Will possibly repeat U/S in 10-14 days, if HCG level is increased   - Discharge home - Keep appt in MAU on Friday 05/17/2023 for rpt hCG - Patient verbalized an understanding of the plan of care and agrees.   Raelyn Mora 05/15/2023, 2:50 PM

## 2023-05-15 NOTE — MAU Note (Signed)
 Alexandra Hart is a 33 y.o. at Unknown here in MAU reporting: she's having light VB today, states last night flow was moderate.  Denies recent intercourse.  Denies pain, had cramps last night.  LMP: beginning of February, had VB again on 04/27/2023 Onset of complaint: last night Pain score: 0 Vitals:   05/15/23 1029  BP: 125/71  Pulse: 81  Resp: 20  Temp: 98 F (36.7 C)  SpO2: 100%     FHT: NA  Lab orders placed from triage: UA

## 2023-05-15 NOTE — Discharge Instructions (Signed)
 Return to MAU: If you have heavier bleeding (like the 5th pad in the picture displayed in MAU) that soaks through more that 2 pads per hour for an hour or more If you bleed so much that you feel like you might pass out or you do pass out If you have significant abdominal pain that is not improved with Tylenol 1000 mg every 8 hours as needed for pain If you develop a fever > 100.5

## 2023-05-16 LAB — RPR: RPR Ser Ql: NONREACTIVE

## 2023-05-16 LAB — GC/CHLAMYDIA PROBE AMP (~~LOC~~) NOT AT ARMC
Chlamydia: NEGATIVE
Comment: NEGATIVE
Comment: NORMAL
Neisseria Gonorrhea: NEGATIVE

## 2023-05-17 ENCOUNTER — Telehealth: Payer: Self-pay | Admitting: Obstetrics and Gynecology

## 2023-05-17 ENCOUNTER — Inpatient Hospital Stay (HOSPITAL_COMMUNITY)
Admission: AD | Admit: 2023-05-17 | Discharge: 2023-05-17 | Disposition: A | Discharge status: Home / Self Care | Attending: Obstetrics and Gynecology | Admitting: Obstetrics and Gynecology

## 2023-05-17 ENCOUNTER — Encounter (HOSPITAL_COMMUNITY): Payer: Self-pay | Admitting: Obstetrics and Gynecology

## 2023-05-17 ENCOUNTER — Inpatient Hospital Stay (HOSPITAL_COMMUNITY)
Admission: AD | Admit: 2023-05-17 | Discharge: 2023-05-17 | Disposition: A | Discharge status: Home / Self Care | Source: Ambulatory Visit | Attending: Family Medicine | Admitting: Family Medicine

## 2023-05-17 DIAGNOSIS — Z3201 Encounter for pregnancy test, result positive: Secondary | ICD-10-CM | POA: Diagnosis present

## 2023-05-17 DIAGNOSIS — O039 Complete or unspecified spontaneous abortion without complication: Secondary | ICD-10-CM | POA: Diagnosis not present

## 2023-05-17 DIAGNOSIS — O209 Hemorrhage in early pregnancy, unspecified: Secondary | ICD-10-CM | POA: Diagnosis present

## 2023-05-17 LAB — HCG, QUANTITATIVE, PREGNANCY: hCG, Beta Chain, Quant, S: 202 m[IU]/mL — ABNORMAL HIGH (ref <5)

## 2023-05-17 NOTE — MAU Provider Note (Signed)
 History   Chief Complaint:  Follow-up   Alexandra Hart is  33 y.o. [id number] No LMP recorded (lmp unknown). Patient is pregnant.. Patient is here for follow up of quantitative HCG and ongoing surveillance of pregnancy status. She is Unknown weeks gestation by unknown.    Since her last visit, the patient is without new complaint. The patient reports bleeding as  lighter than period.  She denies any pain.  General ROS:  positive for light vaginal bleeding  Her previous Quantitative HCG values are:  Recent Labs  Lab 05/11/23 1118 05/15/23 1315  HCGBETAQNT 1,920* 580*    Physical Exam   Blood pressure (!) 126/43, pulse 66, temperature 97.8 F (36.6 C), resp. rate 18, not currently breastfeeding.  Focused Gynecological Exam: examination not indicated  Labs: Results for orders placed or performed during the hospital encounter of 05/17/23 (from the past 24 hours)  hCG, quantitative, pregnancy   Collection Time: 05/17/23  1:35 PM  Result Value Ref Range   hCG, Beta Chain, Quant, S 202 (H) <5 mIU/mL    Ultrasound Studies:   US OB LESS THAN 14 WEEKS WITH OB TRANSVAGINAL Result Date: 05/15/2023 CLINICAL DATA:  Vaginal bleeding in first trimester of pregnancy. EXAM: OBSTETRIC <14 WK [place] AND TRANSVAGINAL OB US TECHNIQUE: Both transabdominal and transvaginal ultrasound examinations were performed for complete evaluation of the gestation as well as the maternal uterus, adnexal regions, and pelvic cul-de-sac. Transvaginal technique was performed to assess early pregnancy. COMPARISON:  None Available. FINDINGS: Intrauterine gestational sac: Single Yolk sac:  Not Visualized. Embryo:  Not Visualized. Cardiac Activity: Not Visualized. MSD: 8.1 mm   5 w   4 d Subchorionic hemorrhage:  None visualized. Maternal uterus/adnexae: Ovaries unremarkable. No free fluid is noted. IMPRESSION: Probable early intrauterine gestational sac, but no yolk sac, fetal pole, or cardiac activity yet visualized. Recommend  follow-up quantitative B-HCG levels and follow-up US in 14 days to assess viability. This recommendation follows SRU consensus guidelines: Diagnostic Criteria for Nonviable Pregnancy Early in the First Trimester. Malva Limes Med 2013; 829:5621-30. Electronically Signed   By: Lupita Raider M.D.   On: 05/15/2023 17:06   Assessment:   1. Miscarriage      Plan: -Discharge home in stable condition -Miscarriage precautions discussed -Patient advised to follow-up with [facility] in 1 week for repeat hCG and with a provider at [facility] in 2 weeks -Patient may return to MAU as needed or if her condition were to change or worsen -Patient verbalized an understanding of the plan of care and agrees.    Raelyn Mora, CNM 05/17/2023, 1:52 PM

## 2023-05-17 NOTE — MAU Note (Signed)
 Pt her for f/I HCG labs. Denies pain and reprots bleeding is not as heavy.

## 2023-05-17 NOTE — Telephone Encounter (Signed)
 Patient ID verified by 2 identifiers... TC to discuss the hCG results. Discussed dx of SAB confirmed with 2 hCG that dropped by more than 50% each time. Patient reports moderate blood on her pad over "several hours" for the past 2 days. Denies increased pain. Advised that is normal VB for SAB. Discussed that VB is normal for SAB and should lighten up as hCG clears her body. Plan will be to have hCG drawn in 1 week and see a provider in 2 weeks at [facility]. Patient verbalized an understanding of the plan of care and agrees.  Raelyn Mora, CNM  05/17/2023 6:07 PM

## 2023-05-24 ENCOUNTER — Other Ambulatory Visit

## 2023-05-24 DIAGNOSIS — O039 Complete or unspecified spontaneous abortion without complication: Secondary | ICD-10-CM | POA: Diagnosis not present

## 2023-05-25 LAB — BETA HCG QUANT (REF LAB): hCG Quant: 132 m[IU]/mL

## 2023-05-28 ENCOUNTER — Other Ambulatory Visit

## 2023-05-28 ENCOUNTER — Encounter: Payer: Self-pay | Admitting: Obstetrics and Gynecology

## 2023-05-28 DIAGNOSIS — O039 Complete or unspecified spontaneous abortion without complication: Secondary | ICD-10-CM

## 2023-05-29 LAB — BETA HCG QUANT (REF LAB): hCG Quant: 76 m[IU]/mL

## 2023-05-31 ENCOUNTER — Ambulatory Visit: Admitting: Nurse Practitioner

## 2023-06-03 ENCOUNTER — Ambulatory Visit: Admitting: Obstetrics

## 2023-06-03 ENCOUNTER — Encounter: Payer: Self-pay | Admitting: Obstetrics

## 2023-06-03 VITALS — BP 123/83 | HR 72 | Ht 61.0 in | Wt 167.7 lb

## 2023-06-03 DIAGNOSIS — Z3009 Encounter for other general counseling and advice on contraception: Secondary | ICD-10-CM | POA: Diagnosis not present

## 2023-06-03 DIAGNOSIS — O039 Complete or unspecified spontaneous abortion without complication: Secondary | ICD-10-CM | POA: Diagnosis not present

## 2023-06-03 DIAGNOSIS — Z30011 Encounter for initial prescription of contraceptive pills: Secondary | ICD-10-CM | POA: Diagnosis not present

## 2023-06-03 DIAGNOSIS — Z3A01 Less than 8 weeks gestation of pregnancy: Secondary | ICD-10-CM | POA: Diagnosis not present

## 2023-06-03 DIAGNOSIS — J301 Allergic rhinitis due to pollen: Secondary | ICD-10-CM | POA: Diagnosis not present

## 2023-06-03 MED ORDER — NORGESTIMATE-ETH ESTRADIOL 0.25-35 MG-MCG PO TABS
1.0000 | ORAL_TABLET | Freq: Every day | ORAL | 11 refills | Status: AC
Start: 2023-06-03 — End: ?

## 2023-06-03 MED ORDER — LORATADINE 10 MG PO TABS: 10.0000 mg | ORAL_TABLET | Freq: Every day | ORAL | 11 refills | Status: AC

## 2023-06-03 NOTE — Progress Notes (Signed)
 Pt presents for sab f/u. Pt has no questions or concerns at this time.

## 2023-06-03 NOTE — Progress Notes (Signed)
 History of SAB.  Patient ID: Alexandra Hart, female   DOB: 08-10-1990, 33 y.o.   MRN: [id number]  Chief Complaint  Patient presents with   Follow-up    SAB    HPI Alexandra Hart is a 33 y.o. female.  History of early first trimester SAB ~ 2 weeks ago.  No complaints today. HPI  Past Medical History:  Diagnosis Date   Generalized anxiety disorder 05/19/2018   Headache    Post partum depression 05/19/2018   Supervision of normal first pregnancy, antepartum 12/27/2016    Clinic  [facility] Prenatal Labs Dating   LMP Blood type: O/Positive/-- (11/19 1007) O pos Genetic Screen 1 Screen: nl NT    AFP:  Negative  Antibody:Negative (11/19 1007)Neg Anatomic [place] normal Rubella: 4.92 (11/19 1007)Immune GTT Early:               Third trimester: nl 2 hour RPR: Non Reactive (03/11 1115) NR Flu vaccine 01/21/17 HBsAg: Negative (11/19 1007) Neg TDaP vaccine  05/13/17                Vaginal Pap smear, abnormal     Past Surgical History:  Procedure Laterality Date   NO PAST SURGERIES      Family History  Problem Relation Age of Onset   Varicose Veins Mother    Miscarriages / Stillbirths Sister    Asthma Maternal Grandmother    Diabetes Maternal Grandmother    Hypertension Maternal Grandmother    Varicose Veins Maternal Grandmother     Social History Social History   Tobacco Use   Smoking status: Former    Current packs/day: 0.00    Types: Cigarettes    Quit date: 11/27/2016    Years since quitting: 6.5    Passive exposure: Never   Smokeless tobacco: Never  Vaping Use   Vaping status: Every Day  Substance Use Topics   Alcohol use: Not Currently   Drug use: No    Allergies  Allergen Reactions   Shrimp [Shellfish Allergy] Nausea And Vomiting   Tomato Itching and Rash    Current Outpatient Medications  Medication Sig Dispense Refill   fluticasone (FLONASE) 50 MCG/ACT nasal spray Place 1 spray into both nostrils daily. 48 g 2   loratadine (CLARITIN) 10 MG tablet Take 1 tablet (10 mg  total) by mouth daily. 30 tablet 11   norgestimate-ethinyl estradiol (ORTHO-CYCLEN) 0.25-35 MG-MCG tablet Take 1 tablet by mouth daily. Starting on the first day of next period. 28 tablet 11   Prenatal Vit-Fe Fumarate-FA (MULTIVITAMIN-PRENATAL) 27-0.8 MG TABS tablet Take 1 tablet by mouth daily at 12 noon. 30 tablet 0   pseudoephedrine (SUDAFED) 30 MG tablet Take 1 tablet (30 mg total) by mouth every 8 (eight) hours as needed for congestion. 30 tablet 0   No current facility-administered medications for this visit.    Review of Systems Review of Systems Constitutional: negative for fatigue and weight loss Respiratory: negative for cough and wheezing Cardiovascular: negative for chest pain, fatigue and palpitations Gastrointestinal: negative for abdominal pain and change in bowel habits Genitourinary:negative Integument/breast: negative for nipple discharge Musculoskeletal:negative for myalgias Neurological: negative for gait problems and tremors Behavioral/Psych: negative for abusive relationship, depression Endocrine: negative for temperature intolerance      Blood pressure 123/83, pulse 72, height 5\' 1"  (1.549 m), weight 167 lb 11.2 oz (76.1 kg), not currently breastfeeding.  Physical Exam Physical Exam General:   Alert and no distress  Skin:   no rash or abnormalities  Lungs:  clear to auscultation bilaterally  Heart:   regular rate and rhythm, S1, S2 normal, no murmur, click, rub or gallop  The remainder of the physical exam deferred.   I have spent a total of 15 minutes of face-to-face time, excluding clinical staff time, reviewing notes and preparing to see patient, ordering tests and/or medications, and counseling the patient.   Data Reviewed Wet Prep and Cultures  Assessment     1. SAB (spontaneous abortion) (Primary) - clinically stable  2. Encounter for other general counseling and advice on contraception - options discussed.  Wants OCP's  3. Encounter for  initial prescription of contraceptive pills Rx: - norgestimate-ethinyl estradiol (ORTHO-CYCLEN) 0.25-35 MG-MCG tablet; Take 1 tablet by mouth daily. Starting on the first day of next period.  Dispense: 28 tablet; Refill: 11  4. Seasonal allergic rhinitis due to pollen Rx: - loratadine (CLARITIN) 10 MG tablet; Take 1 tablet (10 mg total) by mouth daily.  Dispense: 30 tablet; Refill: 11     Plan   Follow up in 3 months.  Annual / Pap.  No orders of the defined types were placed in this encounter.  Meds ordered this encounter  Medications   loratadine (CLARITIN) 10 MG tablet    Sig: Take 1 tablet (10 mg total) by mouth daily.    Dispense:  30 tablet    Refill:  11   norgestimate-ethinyl estradiol (ORTHO-CYCLEN) 0.25-35 MG-MCG tablet    Sig: Take 1 tablet by mouth daily. Starting on the first day of next period.    Dispense:  28 tablet    Refill:  11     Brock Bad, MD, FACOG Attending Obstetrician & Gynecologist, [facility], [facility], [place] 06/03/2023

## 2023-06-15 DIAGNOSIS — Z419 Encounter for procedure for purposes other than remedying health state, unspecified: Secondary | ICD-10-CM | POA: Diagnosis not present

## 2023-07-15 DIAGNOSIS — Z419 Encounter for procedure for purposes other than remedying health state, unspecified: Secondary | ICD-10-CM | POA: Diagnosis not present

## 2023-07-22 ENCOUNTER — Encounter: Payer: Self-pay | Admitting: Obstetrics and Gynecology

## 2023-08-05 ENCOUNTER — Ambulatory Visit: Admitting: Obstetrics and Gynecology

## 2023-08-05 NOTE — Progress Notes (Deleted)
   RETURN GYNECOLOGY VISIT  Subjective:  Alexandra Hart is a 33 y.o. [phone] with LMP *** presenting for ***  Recent rx for COCs Messaged with bleeding from April 27-May 19th.    Menstrual Hx:  PMH: PSH: Meds: All: OB: Pap Hx:  Soc: FHx:  Objective:  There were no vitals filed for this visit.  General:  Alert, oriented and cooperative. Patient is in no acute distress.  Skin: Skin is warm and dry. No rash noted.   Cardiovascular: Normal heart rate noted  Respiratory: Normal respiratory effort, no problems with respiration noted  Abdomen: Soft, non-tender, non-distended   Pelvic: NEFG.   Exam performed in the presence of a chaperone  Assessment and Plan:  Alexandra Hart is a 33 y.o. with ***  There are no diagnoses linked to this encounter.  No follow-ups on file.  Future Appointments  Date Time Provider Department Center  08/05/2023  8:55 AM Izell Marsh, MD [facility] None    Izell Marsh, MD

## 2023-08-15 DIAGNOSIS — Z419 Encounter for procedure for purposes other than remedying health state, unspecified: Secondary | ICD-10-CM | POA: Diagnosis not present

## 2023-09-14 DIAGNOSIS — Z419 Encounter for procedure for purposes other than remedying health state, unspecified: Secondary | ICD-10-CM | POA: Diagnosis not present

## 2023-10-07 DIAGNOSIS — H5213 Myopia, bilateral: Secondary | ICD-10-CM | POA: Diagnosis not present

## 2023-10-10 IMAGING — US US OB LIMITED
1 series · 7 of 7 positions shown · non-contrast
Comparison: none

[Series 1: us ob limited · 7 of 7 slices shown]
[im 1/7]
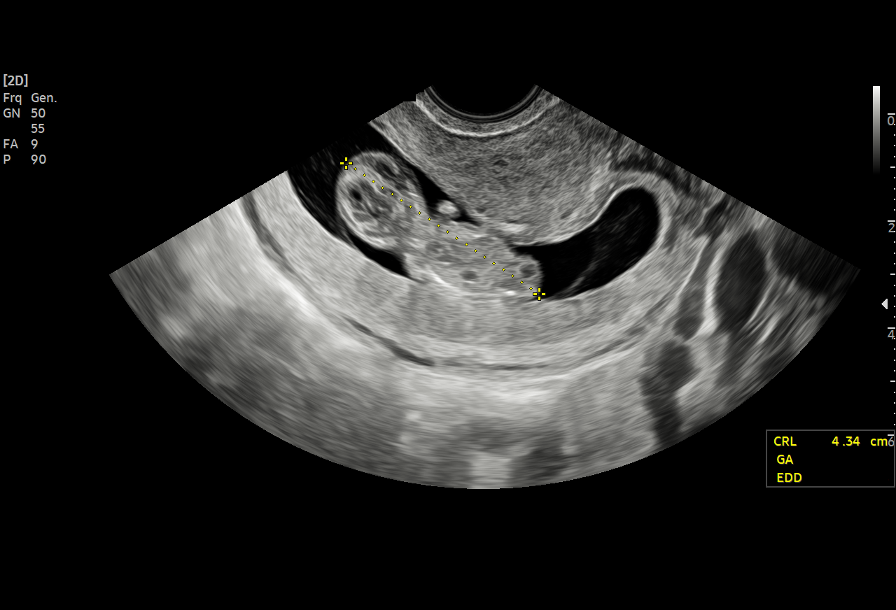
[im 2/7]
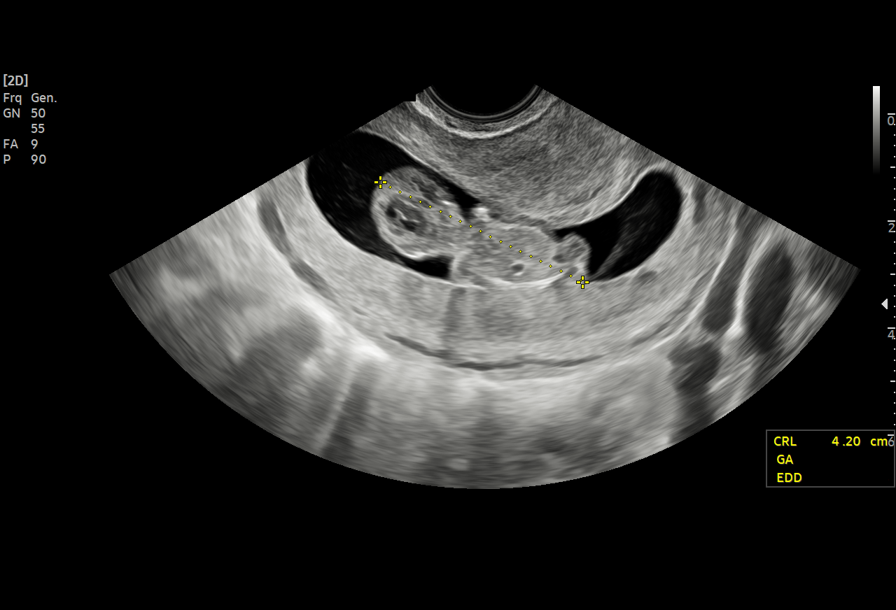
[im 3/7]
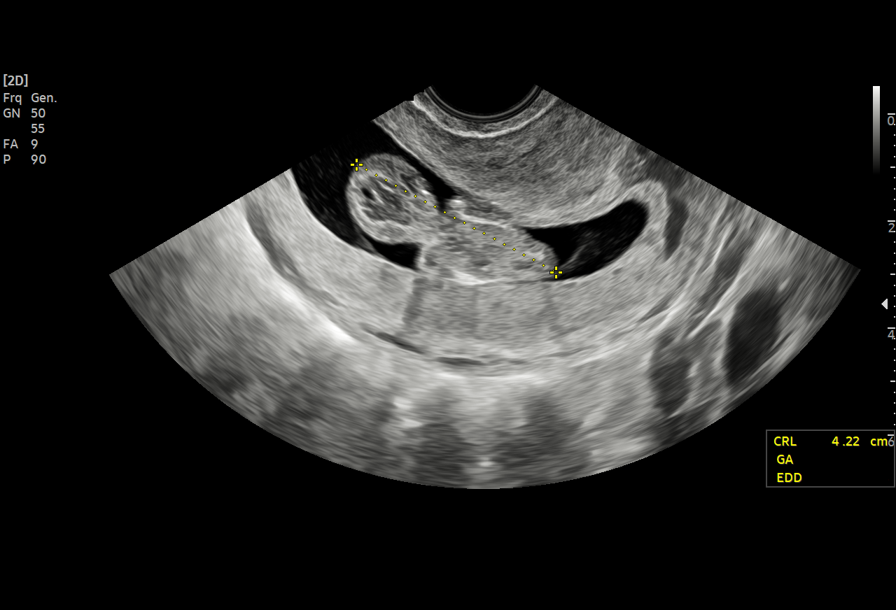
[im 4/7]
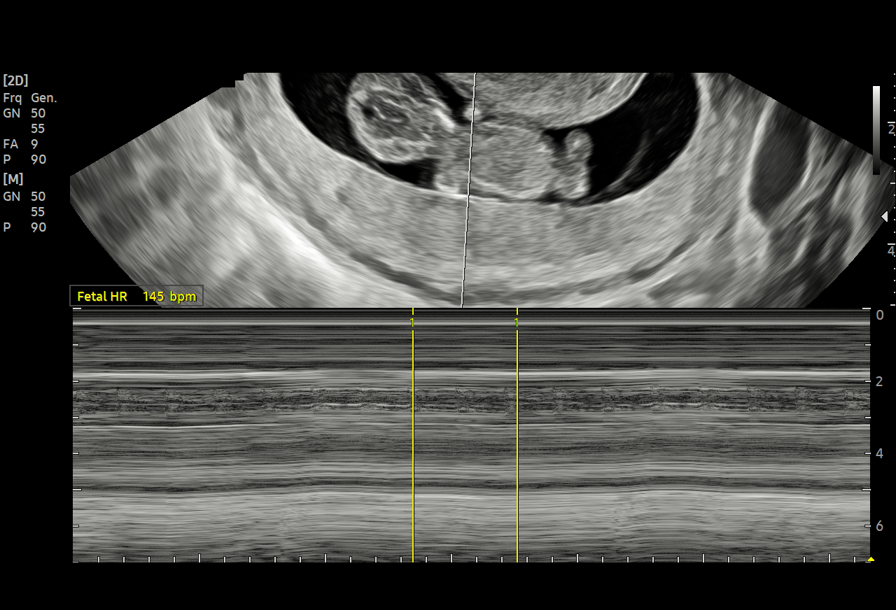
[im 5/7]
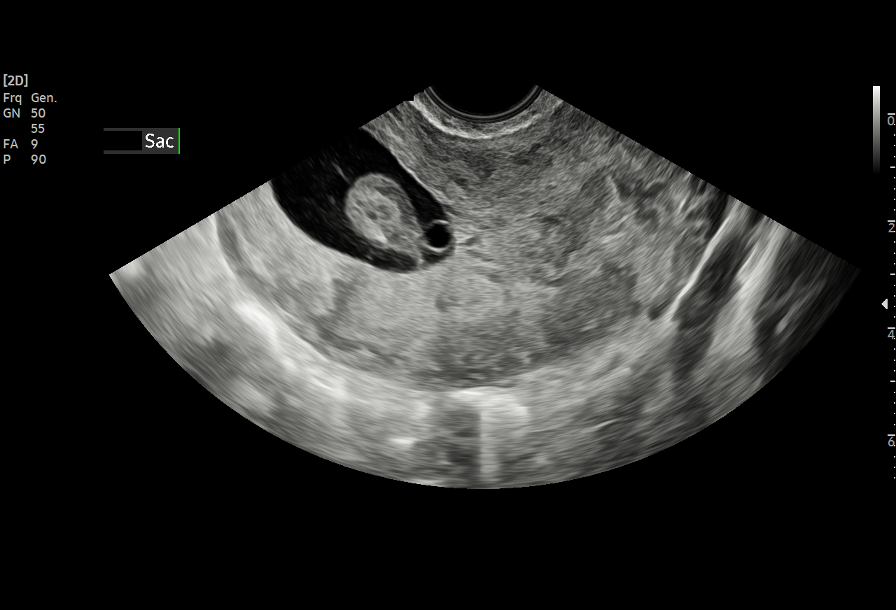
[im 6/7]
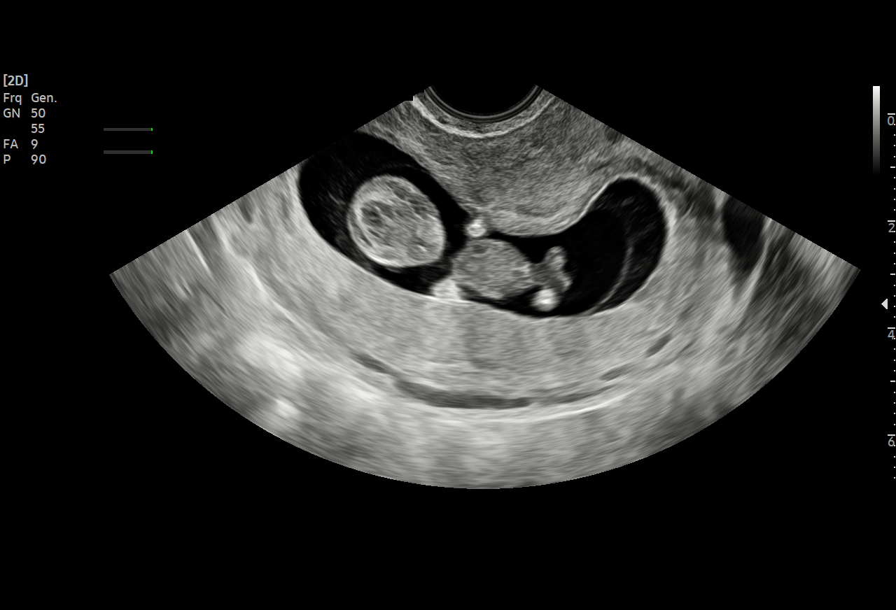
[im 7/7]
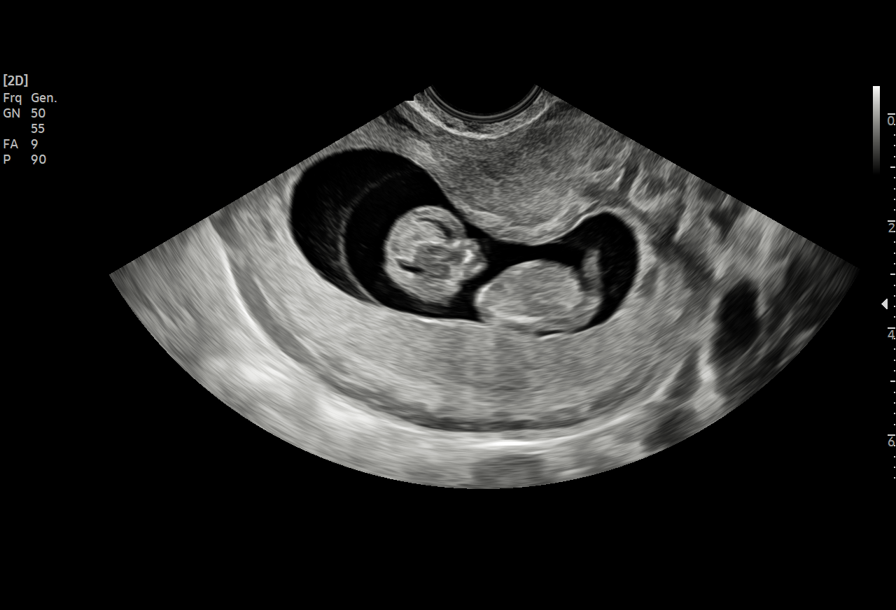

[7 of 7 positions shown; findings below may reference images not displayed]

[REDACTED]care

 1  [HOSPITAL]                         [address].0     ABIMELK TIGER

Indications

 11 weeks gestation of pregnancy
Fetal Evaluation

 Num Of Fetuses:          1
 Fetal Heart Rate(bpm):   145
 Cardiac Activity:        Observed
Biometry

 CRL:      42.6  mm     G. Age:  11w 0d                  EDD:    03/07/22
Gestational Age

 Best:          11w 0d     Det. By:  U/S C R L (08/16/21)     EDD:   03/07/22
Comments

 Single live IUP at 99w0d by CRL. Measurements are
 consistent with LMP provided 06/02/21.
Impression

 Single IUP at 99w0d by CRL
 Cardiac activity noted
Recommendations
 F/U OB U/S as clinically indicated

## 2023-10-15 DIAGNOSIS — Z419 Encounter for procedure for purposes other than remedying health state, unspecified: Secondary | ICD-10-CM | POA: Diagnosis not present

## 2023-11-15 DIAGNOSIS — Z419 Encounter for procedure for purposes other than remedying health state, unspecified: Secondary | ICD-10-CM | POA: Diagnosis not present

## 2024-01-15 DIAGNOSIS — Z419 Encounter for procedure for purposes other than remedying health state, unspecified: Secondary | ICD-10-CM | POA: Diagnosis not present

## 2024-02-14 DIAGNOSIS — Z419 Encounter for procedure for purposes other than remedying health state, unspecified: Secondary | ICD-10-CM | POA: Diagnosis not present
